# Patient Record
Sex: Female | Born: 1980 | Race: White | Hispanic: No | Marital: Married | Smoking: Never smoker
Health system: Southern US, Community
[De-identification: ages and names within clinical notes are randomized; demographics above are authoritative.]

## PROBLEM LIST (undated history)

## (undated) DIAGNOSIS — E079 Disorder of thyroid, unspecified: Secondary | ICD-10-CM

## (undated) DIAGNOSIS — E039 Hypothyroidism, unspecified: Secondary | ICD-10-CM

## (undated) DIAGNOSIS — I469 Cardiac arrest, cause unspecified: Secondary | ICD-10-CM

## (undated) HISTORY — PX: NO PAST SURGERIES: SHX2092

---

## 2017-06-10 ENCOUNTER — Emergency Department (HOSPITAL_COMMUNITY)
Admission: EM | Admit: 2017-06-10 | Discharge: 2017-06-10 | Disposition: A | Discharge status: Home / Self Care | Payer: Self-pay | Attending: Emergency Medicine | Admitting: Emergency Medicine

## 2017-06-10 ENCOUNTER — Other Ambulatory Visit: Payer: Self-pay

## 2017-06-10 ENCOUNTER — Encounter (HOSPITAL_COMMUNITY): Payer: Self-pay

## 2017-06-10 DIAGNOSIS — E039 Hypothyroidism, unspecified: Secondary | ICD-10-CM | POA: Insufficient documentation

## 2017-06-10 DIAGNOSIS — Z760 Encounter for issue of repeat prescription: Secondary | ICD-10-CM | POA: Insufficient documentation

## 2017-06-10 HISTORY — DX: Disorder of thyroid, unspecified: E07.9

## 2017-06-10 MED ORDER — LEVOTHYROXINE SODIUM 50 MCG PO TABS: ORAL_TABLET | ORAL | 0 refills | Status: DC

## 2017-06-10 NOTE — ED Provider Notes (Signed)
  [facility]-EMERGENCY DEPT Provider Note   CSN: [id number] Arrival date & time: 06/10/17  1513     History   Chief Complaint Chief Complaint  Patient presents with  . Medication Refill    HPI Jane Walton is a 36 y.o. female who presents to the ED for medication refill. [place] and does not have a PCP yet and needs her thyroid medication.   The history is provided by the patient. A language interpreter was used.  Medication Refill  Medications/supplies requested:  Levothyroxine 50 mg Reason for request:  Medications ran out Medications taken before: yes - see home medications   Patient has complete original prescription information: yes     Past Medical History:  Diagnosis Date  . Thyroid disease     There are no active problems to display for this patient.   History reviewed. No pertinent surgical history.  OB History    Gravida Para Term Preterm AB Living   1             SAB TAB Ectopic Multiple Live Births                   Home Medications    Prior to Admission medications   Medication Sig Start Date End Date Taking? Authorizing Provider  levothyroxine (SYNTHROID, LEVOTHROID) 50 MCG tablet Take one and 1/2 tablet daily. 06/10/17   Janne NapoleonNeese, Hope M, NP    Family History History reviewed. No pertinent family history.  Social History Social History   Tobacco Use  . Smoking status: Never Smoker  . Smokeless tobacco: Never Used  Substance Use Topics  . Alcohol use: Not on file  . Drug use: Not on file     Allergies   Patient has no known allergies.   Review of Systems Review of Systems Patient states no problems just needs her medication  Physical Exam Updated Vital Signs BP 112/72 (BP Location: Left Arm)   Pulse 75   Temp 98.4 F (36.9 C) (Oral)   Resp 16   LMP 05/30/2017   SpO2 100%   Physical Exam  Constitutional: She appears well-developed and well-nourished. No distress.   HENT:  Head: Normocephalic.  Eyes: EOM are normal.  Neck: Neck supple.  Cardiovascular: Normal rate.  Pulmonary/Chest: Effort normal.  Musculoskeletal: Normal range of motion.  Neurological: She is alert.  Psychiatric: She has a normal mood and affect.  Nursing note and vitals reviewed.    ED Treatments / Results  Labs (all labs ordered are listed, but only abnormal results are displayed) Labs Reviewed - No data to display  Radiology No results found.  Procedures Procedures (including critical care time)  Medications Ordered in ED Medications - No data to display   Initial Impression / Assessment and Plan / ED Course  I have reviewed the triage vital signs and the nursing notes. 36 y.o. female here for medication refill. [facility] and will refill medication for this month.   Final Clinical Impressions(s) / ED Diagnoses   Final diagnoses:  Medication refill    ED Discharge Orders        Ordered    levothyroxine (SYNTHROID, LEVOTHROID) 50 MCG tablet     06/10/17 [address]1648       Neese, [place] M, NP 06/10/17 1658    Rolland PorterJames, Mark, MD 06/17/17 1035

## 2017-06-10 NOTE — ED Triage Notes (Addendum)
Pt reports headache 3/10 since yesterday. Pt reports needing her Levothyroxine Rx refilled. [place]  Pt A+OX4, speaking in complete sentences, ambulatory w/ normal gait, pt Arabic speaking primarily.

## 2017-10-15 DIAGNOSIS — E039 Hypothyroidism, unspecified: Secondary | ICD-10-CM | POA: Insufficient documentation

## 2018-01-02 DIAGNOSIS — K5900 Constipation, unspecified: Secondary | ICD-10-CM | POA: Insufficient documentation

## 2018-01-13 ENCOUNTER — Ambulatory Visit (INDEPENDENT_AMBULATORY_CARE_PROVIDER_SITE_OTHER): Payer: Medicaid Other | Admitting: General Practice

## 2018-01-13 DIAGNOSIS — Z3201 Encounter for pregnancy test, result positive: Secondary | ICD-10-CM | POA: Diagnosis not present

## 2018-01-13 DIAGNOSIS — B360 Pityriasis versicolor: Secondary | ICD-10-CM | POA: Insufficient documentation

## 2018-01-13 NOTE — Progress Notes (Signed)
Patient presents to office today for UPT. UPT +. Patient reports first positive home test earlier this month. LMP 10/30/17 EDD 08/06/18 4850w5d. Patient [facility]. Patient reports taking phenergan, miralax, & simethicone PRN. Patient is also taking synthroid. Patient reports initially starting care at [facility] but she wasn't happy with her care there & they couldn't guarantee a female provider so she came here. Discussed with patient that this is a teaching facility and that during their pregnancy there may be female physicians and other healthcare providers involved in their care. This includes, but is not limited to, prenatal visits and ultrasound examinations, as well as, the labor and delivery process and postpartum care. Also discussed with patient that they do have the right to transfer their care to another practice in the event that they do not agree or wish to see female providers under any situation. Informed patient that we will make every attempt to have a female provider care for them, though this cannot be guaranteed, such as in an emergent situation. Also, reminded patient that when they are scheduling their appointments, to request a female provider each time and we will try to accommodate their request.  Patient verbalized understanding to this and agrees. Patient requests new OB appt here. Patient had no other questions.

## 2018-01-14 LAB — POCT PREGNANCY, URINE: Preg Test, Ur: POSITIVE — AB

## 2018-02-01 ENCOUNTER — Ambulatory Visit: Payer: Medicaid Other | Admitting: Clinical

## 2018-02-01 ENCOUNTER — Encounter: Payer: Self-pay | Admitting: Family Medicine

## 2018-02-01 ENCOUNTER — Ambulatory Visit (INDEPENDENT_AMBULATORY_CARE_PROVIDER_SITE_OTHER): Payer: Medicaid Other | Admitting: Family Medicine

## 2018-02-01 DIAGNOSIS — O09529 Supervision of elderly multigravida, unspecified trimester: Secondary | ICD-10-CM | POA: Insufficient documentation

## 2018-02-01 DIAGNOSIS — E559 Vitamin D deficiency, unspecified: Secondary | ICD-10-CM

## 2018-02-01 DIAGNOSIS — Z758 Other problems related to medical facilities and other health care: Secondary | ICD-10-CM | POA: Insufficient documentation

## 2018-02-01 DIAGNOSIS — O0990 Supervision of high risk pregnancy, unspecified, unspecified trimester: Secondary | ICD-10-CM

## 2018-02-01 DIAGNOSIS — Z789 Other specified health status: Secondary | ICD-10-CM

## 2018-02-01 DIAGNOSIS — O0991 Supervision of high risk pregnancy, unspecified, first trimester: Secondary | ICD-10-CM

## 2018-02-01 DIAGNOSIS — O09521 Supervision of elderly multigravida, first trimester: Secondary | ICD-10-CM

## 2018-02-01 DIAGNOSIS — O99281 Endocrine, nutritional and metabolic diseases complicating pregnancy, first trimester: Secondary | ICD-10-CM

## 2018-02-01 DIAGNOSIS — E039 Hypothyroidism, unspecified: Secondary | ICD-10-CM

## 2018-02-01 DIAGNOSIS — O99280 Endocrine, nutritional and metabolic diseases complicating pregnancy, unspecified trimester: Secondary | ICD-10-CM

## 2018-02-01 LAB — POCT URINALYSIS DIP (DEVICE)
BILIRUBIN URINE: NEGATIVE
Glucose, UA: NEGATIVE mg/dL
HGB URINE DIPSTICK: NEGATIVE
KETONES UR: NEGATIVE mg/dL
Leukocytes, UA: NEGATIVE
Nitrite: NEGATIVE
Protein, ur: NEGATIVE mg/dL
Specific Gravity, Urine: 1.01 (ref 1.005–1.030)
Urobilinogen, UA: 0.2 mg/dL (ref 0.0–1.0)
pH: 5.5 (ref 5.0–8.0)

## 2018-02-01 MED ORDER — CALCIUM CARBONATE-VITAMIN D 500-200 MG-UNIT PO TABS
1.0000 | ORAL_TABLET | Freq: Two times a day (BID) | ORAL | 3 refills | Status: DC
Start: 2018-02-01 — End: 2018-07-15

## 2018-02-01 NOTE — Progress Notes (Signed)
   PRENATAL VISIT NOTE  Subjective:  Jane Walton is a 37 y.o. [phone] at 3468w2d being seen today for transferring prenatal care, [facility]. Had initial labs done there  She is currently monitored for the following issues for this high-risk pregnancy and has Supervision of high risk pregnancy, antepartum; Advanced maternal age in multigravida; Hypothyroidism affecting pregnancy; Constipation; Tinea versicolor; Hypothyroidism (acquired); Language barrier; and Vitamin D deficiency on their problem list.  Patient reports no complaints.  Contractions: Not present. Vag. Bleeding: None.  Movement: Absent. Denies leaking of fluid.   The following portions of the patient's history were reviewed and updated as appropriate: allergies, current medications, past family history, past medical history, past social history, past surgical history and problem list. Problem list updated.  Objective:   Vitals:   02/01/18 1044 02/01/18 1056  BP: 113/64   Pulse: 75   Weight: 169 lb (76.7 kg)   Height:  5' 6.54" (1.69 m)    Fetal Status: Fetal Heart Rate (bpm): 156   Movement: Absent     General:  Alert, oriented and cooperative. Patient is in no acute distress.  Skin: Skin is warm and dry. No rash noted.   Cardiovascular: Normal heart rate noted  Respiratory: Normal respiratory effort, no problems with respiration noted  Abdomen: Soft, gravid, appropriate for gestational age.  Pain/Pressure: Present     Pelvic: Cervical exam deferred        Extremities: Normal range of motion.  Edema: None  Mental Status: Normal mood and affect. Normal behavior. Normal judgment and thought content.   Assessment and Plan:  Pregnancy: [id number] at 7368w2d  1. Supervision of high risk pregnancy, antepartum Anatomy us scheduled - US MFM OB DETAIL +14 WK; Future  2. Multigravida of advanced maternal age in first trimester NIPT today - US MFM OB DETAIL +14 WK; Future - Genetic Screening  3. Hypothyroidism  affecting pregnancy in first trimester Repeat labs in 2nd trimester and adjust prn  4. Language barrier Live Arabic interpreter used   5. Vitamin D deficiency Stop repletion of weekly vitamin D increased from 18-->53, change to daily supplementation. - calcium-vitamin D (OSCAL WITH D) 500-200 MG-UNIT tablet; Take 1 tablet by mouth 2 (two) times daily.  Dispense: 60 tablet; Refill: 3  General obstetric precautions including but not limited to vaginal bleeding, contractions, leaking of fluid and fetal movement were reviewed in detail with the patient. Please refer to After Visit Summary for other counseling recommendations.  Return in 1 month (on 03/01/2018).  Future Appointments  Date Time Provider Department Center  03/03/2018  9:15 AM Anyanwu, Jethro BastosUgonna A, MD [facility]  03/08/2018 11:00 AM [facility] US 3 [facility]    Reva Boresanya S Candita Borenstein, MD

## 2018-02-01 NOTE — Progress Notes (Signed)
Patient reports she has varicose veins that are causing discomfort

## 2018-02-01 NOTE — BH Specialist Note (Signed)
Integrated Behavioral Health Initial Visit  MRN: [id number] Name: Jane Walton  Number of Integrated Behavioral Health Clinician visits:: 1/6 Session Start time: 10:20  Session End time: 10:27 Total time: 7 minutes  Type of Service: Integrated Behavioral Health- Individual/Family Interpretor:Yes.   Interpretor Name and Language: Arabic   Warm Hand Off Completed.       SUBJECTIVE: Jane Haringnwar Myrie is a 37 y.o. female accompanied by Partner/Significant Other Patient was referred by Tinnie Gensanya Pratt, MD for initial OB introduction to integrated behavioral health services. Patient reports the following symptoms/concerns: Pt has no specific concerns today.  Duration of problem: n/a; Severity of problem: n/a  OBJECTIVE: Mood: Appropriate and Affect: Appropriate Risk of harm to self or others: No plan to harm self or others  LIFE CONTEXT: Family and Social: - School/Work: - Self-Care: - Life Changes: Current pregnancy  GOALS ADDRESSED: n/a INTERVENTIONS:   Standardized Assessments completed: GAD-7 and PHQ 9  ASSESSMENT: Patient currently experiencing Supervision of high risk pregnancy, antepartum   Patient may benefit from initial OB introduction to integrated behavioral health services.  PLAN: 1. Follow up with behavioral health clinician on : As needed 2. Referral(s): [facility]) 3. "From scale of 1-10, how likely are you to follow plan?": 10  Rae LipsJamie C McMannes, LCSW  [facility] screen [facility] 02/01/2018  Decreased Interest 0  Down, Depressed, Hopeless 0  PHQ - 2 Score 0  Altered sleeping 0  Tired, decreased energy 0  Change in appetite 0  Feeling bad or failure about yourself  0  Trouble concentrating 0  Moving slowly or fidgety/restless 0  Suicidal thoughts 0  PHQ-9 Score 0   GAD 7 : Generalized Anxiety Score 02/01/2018  Nervous, Anxious, on Edge 0  Control/stop worrying 0  Worry too much - different things 0  Trouble  relaxing 0  Restless 0  Easily annoyed or irritable 0  Afraid - awful might happen 0  Total GAD 7 Score 0

## 2018-02-01 NOTE — Patient Instructions (Addendum)
SELENIUM SULFIDE (se LEE nee um suhl fahyd) Shampoo               .                 . COMMON BRAND NAME (S): Anti-Dandruff Dandrex Selenos SelRx Selseb Selsun Selsun Blue                         : -        -                  .     -           .     .     .     .     .       .            .        .       .                       .         .          .      .  :                   . :     .      .            .           .      .         .                .        Marland Kitchen.                      Marland Kitchen.                     Marland Kitchen.                     .                      5       .         48         .     .      .                         : -                      (          ):           .         Marland Kitchen.      FDA  1-800-FDA-1088.           .       15  30   (59  86  ).   .           . :    .      .                 .Marland Kitchen  2018 Elsevier / Doylene Canning Standard (2008-02-16 15:28:51)

## 2018-02-17 ENCOUNTER — Encounter: Payer: Self-pay | Admitting: *Deleted

## 2018-03-02 ENCOUNTER — Encounter (HOSPITAL_COMMUNITY): Payer: Self-pay

## 2018-03-03 ENCOUNTER — Ambulatory Visit (INDEPENDENT_AMBULATORY_CARE_PROVIDER_SITE_OTHER): Payer: Medicaid Other | Admitting: Obstetrics & Gynecology

## 2018-03-03 VITALS — BP 117/62 | HR 78 | Wt 170.0 lb

## 2018-03-03 DIAGNOSIS — O99282 Endocrine, nutritional and metabolic diseases complicating pregnancy, second trimester: Secondary | ICD-10-CM

## 2018-03-03 DIAGNOSIS — O09529 Supervision of elderly multigravida, unspecified trimester: Secondary | ICD-10-CM

## 2018-03-03 DIAGNOSIS — E039 Hypothyroidism, unspecified: Secondary | ICD-10-CM

## 2018-03-03 DIAGNOSIS — O0990 Supervision of high risk pregnancy, unspecified, unspecified trimester: Secondary | ICD-10-CM

## 2018-03-03 NOTE — Progress Notes (Signed)
   PRENATAL VISIT NOTE  Subjective:  Jane Walton is a 37 y.o. [phone] at [redacted]w[redacted]d being seen today for ongoing prenatal care.  Due to language barrier, an Arabic interpreter was present during the history-taking and subsequent discussion (and for part of the physical exam) with this patient. Accompanied by her husband. She is currently monitored for the following issues for this high-risk pregnancy and has Supervision of high risk pregnancy, antepartum; Advanced maternal age in multigravida; Hypothyroidism affecting pregnancy; Constipation; Tinea versicolor; Hypothyroidism (acquired); Language barrier; and Vitamin D deficiency on their problem list.  Patient reports no complaints.  Contractions: Not present. Vag. Bleeding: None.   . Denies leaking of fluid.   The following portions of the patient's history were reviewed and updated as appropriate: allergies, current medications, past family history, past medical history, past social history, past surgical history and problem list. Problem list updated.  Objective:   Vitals:   03/03/18 0946  BP: 117/62  Pulse: 78  Weight: 170 lb (77.1 kg)    Fetal Status: Fetal Heart Rate (bpm): 152         General:  Alert, oriented and cooperative. Patient is in no acute distress.  Skin: Skin is warm and dry. No rash noted.   Cardiovascular: Normal heart rate noted  Respiratory: Normal respiratory effort, no problems with respiration noted  Abdomen: Soft, gravid, appropriate for gestational age.  Pain/Pressure: Present     Pelvic: Cervical exam deferred        Extremities: Normal range of motion.  Edema: None  Mental Status: Normal mood and affect. Normal behavior. Normal judgment and thought content.   Assessment and Plan:  Pregnancy: [id number] at [redacted]w[redacted]d  1. Antepartum multigravida of advanced maternal age Low risk female on NIPS. Anatomy scan scheduled. AFP today. - AFP, Serum, Open Spina Bifida  2. Hypothyroidism affecting pregnancy in second  trimester Will check labs today. On Synthroid 88 daily.  - TSH - T4, free - T3, free  3. Supervision of high risk pregnancy, antepartum No other complaints or concerns.  Routine obstetric precautions reviewed. Please refer to After Visit Summary for other counseling recommendations.  Return in about 4 weeks (around 03/31/2018) for OB Visit (HOB).  Future Appointments  Date Time Provider Department Center  03/08/2018 11:00 AM [facility] [place] 3 [facility]    Jaynie Collins, MD

## 2018-03-03 NOTE — Progress Notes (Signed)
Video Interperter # [id number]

## 2018-03-03 NOTE — Patient Instructions (Addendum)
Return to clinic for any scheduled appointments or obstetric concerns, or go to [facility] for evaluation  

## 2018-03-05 LAB — T4, FREE: Free T4: 1.38 ng/dL (ref 0.82–1.77)

## 2018-03-05 LAB — AFP, SERUM, OPEN SPINA BIFIDA
AFP MoM: 0.95
AFP Value: 35.1 ng/mL
GEST. AGE ON COLLECTION DATE: 17.6 wk
Maternal Age At EDD: 37 yr
OSBR Risk 1 IN: 10000
Test Results:: NEGATIVE
WEIGHT: 170 [lb_av]

## 2018-03-05 LAB — TSH: TSH: 4.11 u[IU]/mL (ref 0.450–4.500)

## 2018-03-05 LAB — T3, FREE: T3, Free: 2.9 pg/mL (ref 2.0–4.4)

## 2018-03-08 ENCOUNTER — Other Ambulatory Visit (HOSPITAL_COMMUNITY): Payer: Self-pay | Admitting: *Deleted

## 2018-03-08 ENCOUNTER — Ambulatory Visit (HOSPITAL_COMMUNITY)
Admission: RE | Admit: 2018-03-08 | Discharge: 2018-03-08 | Disposition: A | Discharge status: Home / Self Care | Payer: Medicaid Other | Source: Ambulatory Visit | Attending: Family Medicine | Admitting: Family Medicine

## 2018-03-08 ENCOUNTER — Encounter (HOSPITAL_COMMUNITY): Payer: Self-pay

## 2018-03-08 DIAGNOSIS — O0992 Supervision of high risk pregnancy, unspecified, second trimester: Secondary | ICD-10-CM | POA: Insufficient documentation

## 2018-03-08 DIAGNOSIS — Z3A18 18 weeks gestation of pregnancy: Secondary | ICD-10-CM | POA: Diagnosis not present

## 2018-03-08 DIAGNOSIS — O09522 Supervision of elderly multigravida, second trimester: Secondary | ICD-10-CM | POA: Diagnosis not present

## 2018-03-08 DIAGNOSIS — Z362 Encounter for other antenatal screening follow-up: Secondary | ICD-10-CM

## 2018-03-08 DIAGNOSIS — O09521 Supervision of elderly multigravida, first trimester: Secondary | ICD-10-CM

## 2018-03-08 DIAGNOSIS — O0990 Supervision of high risk pregnancy, unspecified, unspecified trimester: Secondary | ICD-10-CM | POA: Diagnosis present

## 2018-03-08 DIAGNOSIS — E039 Hypothyroidism, unspecified: Secondary | ICD-10-CM | POA: Insufficient documentation

## 2018-03-08 DIAGNOSIS — Z363 Encounter for antenatal screening for malformations: Secondary | ICD-10-CM | POA: Diagnosis not present

## 2018-03-08 DIAGNOSIS — O99282 Endocrine, nutritional and metabolic diseases complicating pregnancy, second trimester: Secondary | ICD-10-CM | POA: Diagnosis not present

## 2018-03-31 ENCOUNTER — Ambulatory Visit (INDEPENDENT_AMBULATORY_CARE_PROVIDER_SITE_OTHER): Payer: Medicaid Other | Admitting: Obstetrics and Gynecology

## 2018-03-31 ENCOUNTER — Encounter (HOSPITAL_COMMUNITY): Payer: Self-pay

## 2018-03-31 ENCOUNTER — Other Ambulatory Visit (HOSPITAL_COMMUNITY): Payer: Self-pay | Admitting: *Deleted

## 2018-03-31 ENCOUNTER — Ambulatory Visit (HOSPITAL_COMMUNITY)
Admission: RE | Admit: 2018-03-31 | Discharge: 2018-03-31 | Disposition: A | Discharge status: Home / Self Care | Payer: Medicaid Other | Source: Ambulatory Visit | Attending: Family Medicine | Admitting: Family Medicine

## 2018-03-31 VITALS — BP 95/72 | HR 77 | Wt 179.7 lb

## 2018-03-31 DIAGNOSIS — O0990 Supervision of high risk pregnancy, unspecified, unspecified trimester: Secondary | ICD-10-CM

## 2018-03-31 DIAGNOSIS — Z3A21 21 weeks gestation of pregnancy: Secondary | ICD-10-CM

## 2018-03-31 DIAGNOSIS — Z789 Other specified health status: Secondary | ICD-10-CM | POA: Diagnosis not present

## 2018-03-31 DIAGNOSIS — E039 Hypothyroidism, unspecified: Secondary | ICD-10-CM

## 2018-03-31 DIAGNOSIS — O99282 Endocrine, nutritional and metabolic diseases complicating pregnancy, second trimester: Secondary | ICD-10-CM

## 2018-03-31 DIAGNOSIS — Z23 Encounter for immunization: Secondary | ICD-10-CM

## 2018-03-31 DIAGNOSIS — O0992 Supervision of high risk pregnancy, unspecified, second trimester: Secondary | ICD-10-CM | POA: Diagnosis present

## 2018-03-31 DIAGNOSIS — Z362 Encounter for other antenatal screening follow-up: Secondary | ICD-10-CM | POA: Diagnosis present

## 2018-03-31 DIAGNOSIS — O09522 Supervision of elderly multigravida, second trimester: Secondary | ICD-10-CM

## 2018-03-31 NOTE — Patient Instructions (Signed)
Vitamin D Deficiency °Vitamin D deficiency is when your body does not have enough vitamin D. Vitamin D is important to your body for many reasons: °· It helps the body to absorb two important minerals, called calcium and phosphorus. °· It plays a role in bone health. °· It may help to prevent some diseases, such as diabetes and multiple sclerosis. °· It plays a role in muscle function, including heart function. ° °You can get vitamin D by: °· Eating foods that naturally contain vitamin D. °· Eating or drinking milk or other dairy products that have vitamin D added to them. °· Taking a vitamin D supplement or a multivitamin supplement that contains vitamin D. °· Being in the sun. Your body naturally makes vitamin D when your skin is exposed to sunlight. Your body changes the sunlight into a form of the vitamin that the body can use. ° °If vitamin D deficiency is severe, it can cause a condition in which your bones become soft. In adults, this condition is called osteomalacia. In children, this condition is called rickets. °What are the causes? °Vitamin D deficiency may be caused by: °· Not eating enough foods that contain vitamin D. °· Not getting enough sun exposure. °· Having certain digestive system diseases that make it difficult for your body to absorb vitamin D. These diseases include Crohn disease, chronic pancreatitis, and cystic fibrosis. °· Having a surgery in which a part of the stomach or a part of the small intestine is removed. °· Being obese. °· Having chronic kidney disease or liver disease. ° °What increases the risk? °This condition is more likely to develop in: °· Older people. °· People who do not spend much time outdoors. °· People who live in a long-term care facility. °· People who have had broken bones. °· People with weak or thin bones (osteoporosis). °· People who have a disease or condition that changes how the body absorbs vitamin D. °· People who have dark skin. °· People who take certain  medicines, such as steroid medicines or certain seizure medicines. °· People who are overweight or obese. ° °What are the signs or symptoms? °In mild cases of vitamin D deficiency, there may not be any symptoms. If the condition is severe, symptoms may include: °· Bone pain. °· Muscle pain. °· Falling often. °· Broken bones caused by a minor injury. ° °How is this diagnosed? °This condition is usually diagnosed with a blood test. °How is this treated? °Treatment for this condition may depend on what caused the condition. Treatment options include: °· Taking vitamin D supplements. °· Taking a calcium supplement. Your health care provider will suggest what dose is best for you. ° °Follow these instructions at home: °· Take medicines and supplements only as told by your health care provider. °· Eat foods that contain vitamin D. Choices include: °? Fortified dairy products, cereals, or juices. Fortified means that vitamin D has been added to the food. Check the label on the package to be sure. °? Fatty fish, such as salmon or trout. °? Eggs. °? Oysters. °· Do not use a tanning bed. °· Maintain a healthy weight. Lose weight, if needed. °· Keep all follow-up visits as told by your health care provider. This is important. °Contact a health care provider if: °· Your symptoms do not go away. °· You feel like throwing up (nausea) or you throw up (vomit). °· You have fewer bowel movements than usual or it is difficult for you to have a   bowel movement (constipation). °This information is not intended to replace advice given to you by your health care provider. Make sure you discuss any questions you have with your health care provider. °Document Released: 09/08/2011 Document Revised: 11/28/2015 Document Reviewed: 11/01/2014 °Elsevier Interactive Patient Education © 2018 Elsevier Inc. ° °

## 2018-03-31 NOTE — Progress Notes (Signed)
   PRENATAL VISIT NOTE  Subjective:  Jane Walton is a 37 y.o. [phone] at [redacted]w[redacted]d being seen today for ongoing prenatal care.  She is currently monitored for the following issues for this high-risk pregnancy and has Supervision of high risk pregnancy, antepartum; Advanced maternal age in multigravida; Hypothyroidism affecting pregnancy; Constipation; Tinea versicolor; Hypothyroidism (acquired); Language barrier; and Vitamin D deficiency on their problem list.  Patient reports occasional mild cramping.  Contractions: Not present. Vag. Bleeding: None.  Movement: Present. Denies leaking of fluid.   The following portions of the patient's history were reviewed and updated as appropriate: allergies, current medications, past family history, past medical history, past social history, past surgical history and problem list. Problem list updated.  Objective:   Vitals:   03/31/18 0946  BP: 95/72  Pulse: 77  Weight: 179 lb 11.2 oz (81.5 kg)    Fetal Status: Fetal Heart Rate (bpm): 146   Movement: Present     General:  Alert, oriented and cooperative. Patient is in no acute distress.  Skin: Skin is warm and dry. No rash noted.   Cardiovascular: Normal heart rate noted  Respiratory: Normal respiratory effort, no problems with respiration noted  Abdomen: Soft, gravid, appropriate for gestational age.  Pain/Pressure: Present     Pelvic: Cervical exam deferred        Extremities: Normal range of motion.  Edema: None  Mental Status: Normal mood and affect. Normal behavior. Normal judgment and thought content.   Assessment and Plan:  Pregnancy: [id number] at [redacted]w[redacted]d  1. Supervision of high risk pregnancy, antepartum Anatomy normal, f/u for back views today Needs rubella screen next visit ([facility]B labs done at [facility])  2. Multigravida of advanced maternal age in second trimester  3. Language barrier Print production planner used  4. Hypothyroidism affecting pregnancy in second trimester Last labwork wnl Cont  synthroid 88 mcg Recheck with 3rd trim labwork   Preterm labor symptoms and general obstetric precautions including but not limited to vaginal bleeding, contractions, leaking of fluid and fetal movement were reviewed in detail with the patient. Please refer to After Visit Summary for other counseling recommendations.  Return in about 4 weeks (around 04/28/2018) for OB visit.  No future appointments.  Conan Bowens, MD

## 2018-04-28 ENCOUNTER — Encounter: Payer: Self-pay | Admitting: Obstetrics and Gynecology

## 2018-04-28 ENCOUNTER — Encounter (HOSPITAL_COMMUNITY): Payer: Self-pay

## 2018-04-28 ENCOUNTER — Ambulatory Visit (INDEPENDENT_AMBULATORY_CARE_PROVIDER_SITE_OTHER): Payer: Medicaid Other | Admitting: Obstetrics and Gynecology

## 2018-04-28 ENCOUNTER — Ambulatory Visit (HOSPITAL_COMMUNITY)
Admission: RE | Admit: 2018-04-28 | Discharge: 2018-04-28 | Disposition: A | Discharge status: Home / Self Care | Payer: Medicaid Other | Source: Ambulatory Visit | Attending: Obstetrics and Gynecology | Admitting: Obstetrics and Gynecology

## 2018-04-28 VITALS — BP 112/65 | HR 85 | Wt 186.7 lb

## 2018-04-28 DIAGNOSIS — Z3A25 25 weeks gestation of pregnancy: Secondary | ICD-10-CM | POA: Diagnosis not present

## 2018-04-28 DIAGNOSIS — O99282 Endocrine, nutritional and metabolic diseases complicating pregnancy, second trimester: Secondary | ICD-10-CM | POA: Insufficient documentation

## 2018-04-28 DIAGNOSIS — E039 Hypothyroidism, unspecified: Secondary | ICD-10-CM

## 2018-04-28 DIAGNOSIS — O09522 Supervision of elderly multigravida, second trimester: Secondary | ICD-10-CM | POA: Insufficient documentation

## 2018-04-28 DIAGNOSIS — Z362 Encounter for other antenatal screening follow-up: Secondary | ICD-10-CM | POA: Insufficient documentation

## 2018-04-28 DIAGNOSIS — O99283 Endocrine, nutritional and metabolic diseases complicating pregnancy, third trimester: Secondary | ICD-10-CM

## 2018-04-28 DIAGNOSIS — O99280 Endocrine, nutritional and metabolic diseases complicating pregnancy, unspecified trimester: Secondary | ICD-10-CM

## 2018-04-28 DIAGNOSIS — Z789 Other specified health status: Secondary | ICD-10-CM

## 2018-04-28 DIAGNOSIS — O09529 Supervision of elderly multigravida, unspecified trimester: Secondary | ICD-10-CM

## 2018-04-28 DIAGNOSIS — O0990 Supervision of high risk pregnancy, unspecified, unspecified trimester: Secondary | ICD-10-CM

## 2018-04-28 NOTE — Progress Notes (Signed)
Concerned about weight gain- we discussed is about desired weight gain. Discussed eating small amounts .She states she eats small amounts every 2-3 hours and no sweets. Explained will do fasting 2 hour next visit. Discussed types of food choices.

## 2018-04-28 NOTE — Patient Instructions (Signed)

## 2018-04-28 NOTE — Progress Notes (Signed)
   PRENATAL VISIT NOTE  Subjective:  Jane Walton is a 37 y.o. [phone] at [redacted]w[redacted]d being seen today for ongoing prenatal care.  She is currently monitored for the following issues for this high-risk pregnancy and has Supervision of high risk pregnancy, antepartum; Advanced maternal age in multigravida; Hypothyroidism affecting pregnancy; Constipation; Tinea versicolor; Hypothyroidism (acquired); Language barrier; and Vitamin D deficiency on their problem list.  Patient reports no complaints. Some pain when she walks for prolonged periods of time.  Contractions: Not present. Vag. Bleeding: None.  Movement: Present. Denies leaking of fluid.   The following portions of the patient's history were reviewed and updated as appropriate: allergies, current medications, past family history, past medical history, past social history, past surgical history and problem list. Problem list updated.  Objective:   Vitals:   04/28/18 1103  BP: 112/65  Pulse: 85  Weight: 186 lb 11.2 oz (84.7 kg)    Fetal Status: Fetal Heart Rate (bpm): 146   Movement: Present     General:  Alert, oriented and cooperative. Patient is in no acute distress.  Skin: Skin is warm and dry. No rash noted.   Cardiovascular: Normal heart rate noted  Respiratory: Normal respiratory effort, no problems with respiration noted  Abdomen: Soft, gravid, appropriate for gestational age.  Pain/Pressure: Present     Pelvic: Cervical exam deferred        Extremities: Normal range of motion.  Edema: None  Mental Status: Normal mood and affect. Normal behavior. Normal judgment and thought content.   Assessment and Plan:  Pregnancy: [id number] at [redacted]w[redacted]d  1. Supervision of high risk pregnancy, antepartum Concerned about weight gain, reviewed diet, portion control, drinking less juice, encouraged walking as long as she doesn't have much pain  2. Antepartum multigravida of advanced maternal age  64. Hypothyroidism affecting pregnancy,  antepartum Repeat labs next visit  4. Language barrier Print production planner used  Preterm labor symptoms and general obstetric precautions including but not limited to vaginal bleeding, contractions, leaking of fluid and fetal movement were reviewed in detail with the patient. Please refer to After Visit Summary for other counseling recommendations.  Return in about 2 weeks (around 05/12/2018) for needs fasting 28 wk labs/ obfu, OB visit (MD), 2 hr GTT, 3rd trim labs.  No future appointments.  Conan Bowens, MD

## 2018-05-13 ENCOUNTER — Other Ambulatory Visit: Payer: Medicaid Other

## 2018-05-13 ENCOUNTER — Encounter: Payer: Self-pay | Admitting: Obstetrics and Gynecology

## 2018-05-13 ENCOUNTER — Other Ambulatory Visit: Payer: Self-pay | Admitting: *Deleted

## 2018-05-13 ENCOUNTER — Ambulatory Visit (INDEPENDENT_AMBULATORY_CARE_PROVIDER_SITE_OTHER): Payer: Medicaid Other | Admitting: Obstetrics and Gynecology

## 2018-05-13 VITALS — BP 107/64 | HR 86 | Wt 190.0 lb

## 2018-05-13 DIAGNOSIS — O0990 Supervision of high risk pregnancy, unspecified, unspecified trimester: Secondary | ICD-10-CM

## 2018-05-13 DIAGNOSIS — Z789 Other specified health status: Secondary | ICD-10-CM | POA: Diagnosis not present

## 2018-05-13 DIAGNOSIS — O99282 Endocrine, nutritional and metabolic diseases complicating pregnancy, second trimester: Secondary | ICD-10-CM

## 2018-05-13 DIAGNOSIS — E039 Hypothyroidism, unspecified: Secondary | ICD-10-CM | POA: Diagnosis not present

## 2018-05-13 DIAGNOSIS — O0992 Supervision of high risk pregnancy, unspecified, second trimester: Secondary | ICD-10-CM | POA: Diagnosis not present

## 2018-05-13 DIAGNOSIS — O99283 Endocrine, nutritional and metabolic diseases complicating pregnancy, third trimester: Secondary | ICD-10-CM

## 2018-05-13 DIAGNOSIS — O09529 Supervision of elderly multigravida, unspecified trimester: Secondary | ICD-10-CM

## 2018-05-13 DIAGNOSIS — Z23 Encounter for immunization: Secondary | ICD-10-CM | POA: Diagnosis not present

## 2018-05-13 DIAGNOSIS — O09522 Supervision of elderly multigravida, second trimester: Secondary | ICD-10-CM

## 2018-05-13 DIAGNOSIS — O09523 Supervision of elderly multigravida, third trimester: Secondary | ICD-10-CM

## 2018-05-13 NOTE — Progress Notes (Signed)
   PRENATAL VISIT NOTE  Subjective:  Jane Walton is a 37 y.o. [phone] at 2073w5d being seen today for ongoing prenatal care.  She is currently monitored for the following issues for this high-risk pregnancy and has Supervision of high risk pregnancy, antepartum; Advanced maternal age in multigravida; Hypothyroidism affecting pregnancy; Constipation; Tinea versicolor; Hypothyroidism (acquired); Language barrier; and Vitamin D deficiency on their problem list.  Patient reports no complaints.  Contractions: Not present. Vag. Bleeding: None.  Movement: Present. Denies leaking of fluid.   The following portions of the patient's history were reviewed and updated as appropriate: allergies, current medications, past family history, past medical history, past social history, past surgical history and problem list. Problem list updated.  Objective:   Vitals:   05/13/18 0833  BP: 107/64  Pulse: 86  Weight: 190 lb (86.2 kg)    Fetal Status: Fetal Heart Rate (bpm): 144 Fundal Height: 28 cm Movement: Present     General:  Alert, oriented and cooperative. Patient is in no acute distress.  Skin: Skin is warm and dry. No rash noted.   Cardiovascular: Normal heart rate noted  Respiratory: Normal respiratory effort, no problems with respiration noted  Abdomen: Soft, gravid, appropriate for gestational age.  Pain/Pressure: Absent     Pelvic: Cervical exam deferred        Extremities: Normal range of motion.  Edema: None  Mental Status: Normal mood and affect. Normal behavior. Normal judgment and thought content.   Assessment and Plan:  Pregnancy: [id number] at 2973w5d  1. Supervision of high risk pregnancy, antepartum Patient is doing well without complaints Third trimester labs today Patient plans IUD   2. Hypothyroidism affecting pregnancy in third trimester TSH today  3. Multigravida of advanced maternal age in third trimester Low risk NIPS   4. Language barrier Arabic interpreter  present  Preterm labor symptoms and general obstetric precautions including but not limited to vaginal bleeding, contractions, leaking of fluid and fetal movement were reviewed in detail with the patient. Please refer to After Visit Summary for other counseling recommendations.  Return in about 2 weeks (around 05/27/2018) for ROB.  Future Appointments  Date Time Provider Department Center  05/13/2018  9:30 AM [facility]    Catalina AntiguaPeggy Jahfari Ambers, MD

## 2018-05-14 LAB — CBC
HEMATOCRIT: 34.4 % (ref 34.0–46.6)
HEMOGLOBIN: 11.8 g/dL (ref 11.1–15.9)
MCH: 29.6 pg (ref 26.6–33.0)
MCHC: 34.3 g/dL (ref 31.5–35.7)
MCV: 86 fL (ref 79–97)
Platelets: 190 10*3/uL (ref 150–450)
RBC: 3.98 x10E6/uL (ref 3.77–5.28)
RDW: 12.1 % — ABNORMAL LOW (ref 12.3–15.4)
WBC: 7.1 10*3/uL (ref 3.4–10.8)

## 2018-05-14 LAB — GLUCOSE TOLERANCE, 2 HOURS W/ 1HR
GLUCOSE, 1 HOUR: 133 mg/dL (ref 65–179)
Glucose, 2 hour: 113 mg/dL (ref 65–152)
Glucose, Fasting: 75 mg/dL (ref 65–91)

## 2018-05-14 LAB — RPR: RPR Ser Ql: NONREACTIVE

## 2018-05-14 LAB — RUBELLA SCREEN: Rubella Antibodies, IGG: 9.06 index (ref >0.99)

## 2018-05-14 LAB — HIV ANTIBODY (ROUTINE TESTING W REFLEX): HIV Screen 4th Generation wRfx: NONREACTIVE

## 2018-05-14 LAB — TSH: TSH: 3.22 u[IU]/mL (ref 0.450–4.500)

## 2018-06-01 ENCOUNTER — Encounter: Payer: Medicaid Other | Admitting: Family Medicine

## 2018-06-02 ENCOUNTER — Ambulatory Visit (INDEPENDENT_AMBULATORY_CARE_PROVIDER_SITE_OTHER): Payer: Medicaid Other | Admitting: Obstetrics & Gynecology

## 2018-06-02 VITALS — BP 107/64 | HR 85 | Wt 191.4 lb

## 2018-06-02 DIAGNOSIS — Z789 Other specified health status: Secondary | ICD-10-CM

## 2018-06-02 DIAGNOSIS — O09523 Supervision of elderly multigravida, third trimester: Secondary | ICD-10-CM

## 2018-06-02 DIAGNOSIS — Z3A30 30 weeks gestation of pregnancy: Secondary | ICD-10-CM

## 2018-06-02 DIAGNOSIS — O0990 Supervision of high risk pregnancy, unspecified, unspecified trimester: Secondary | ICD-10-CM

## 2018-06-02 DIAGNOSIS — Z603 Acculturation difficulty: Secondary | ICD-10-CM

## 2018-06-02 DIAGNOSIS — O0993 Supervision of high risk pregnancy, unspecified, third trimester: Secondary | ICD-10-CM

## 2018-06-02 DIAGNOSIS — O99283 Endocrine, nutritional and metabolic diseases complicating pregnancy, third trimester: Secondary | ICD-10-CM

## 2018-06-02 DIAGNOSIS — E039 Hypothyroidism, unspecified: Secondary | ICD-10-CM

## 2018-06-02 NOTE — Progress Notes (Signed)
   PRENATAL VISIT NOTE  Subjective:  Jane Walton is a 37 y.o. [phone] at 4962w4d being seen today for ongoing prenatal care.  She is currently monitored for the following issues for this high-risk pregnancy and has Supervision of high risk pregnancy, antepartum; Advanced maternal age in multigravida; Hypothyroidism affecting pregnancy; Constipation; Tinea versicolor; Hypothyroidism (acquired); Language barrier; and Vitamin D deficiency on their problem list.  Patient reports no complaints.  Contractions: Not present. Vag. Bleeding: None.  Movement: Present. Denies leaking of fluid.   The following portions of the patient's history were reviewed and updated as appropriate: allergies, current medications, past family history, past medical history, past social history, past surgical history and problem list. Problem list updated.  Objective:   Vitals:   06/02/18 1030  BP: 107/64  Pulse: 85  Weight: 191 lb 5.8 oz (86.8 kg)    Fetal Status: Fetal Heart Rate (bpm): 150   Movement: Present     General:  Alert, oriented and cooperative. Patient is in no acute distress.  Skin: Skin is warm and dry. No rash noted.   Cardiovascular: Normal heart rate noted  Respiratory: Normal respiratory effort, no problems with respiration noted  Abdomen: Soft, gravid, appropriate for gestational age.  Pain/Pressure: Absent     Pelvic: Cervical exam deferred        Extremities: Normal range of motion.  Edema: None  Mental Status: Normal mood and affect. Normal behavior. Normal judgment and thought content.   Assessment and Plan:  Pregnancy: [id number] at 5362w4d  1. Multigravida of advanced maternal age in third trimester   2. Supervision of high risk pregnancy, antepartum   3. Language barrier - Live interpretor present  4. Hypothyroidism affecting pregnancy in third trimester - normal TSH 11/19  Preterm labor symptoms and general obstetric precautions including but not limited to vaginal bleeding,  contractions, leaking of fluid and fetal movement were reviewed in detail with the patient. Please refer to After Visit Summary for other counseling recommendations.  No follow-ups on file.  No future appointments.  Allie BossierMyra C Kaylor Maiers, MD

## 2018-06-02 NOTE — Progress Notes (Signed)
C/O headaches x1 month and last nose bleed was a week ago.

## 2018-06-17 ENCOUNTER — Ambulatory Visit (INDEPENDENT_AMBULATORY_CARE_PROVIDER_SITE_OTHER): Payer: Medicaid Other | Admitting: Obstetrics & Gynecology

## 2018-06-17 VITALS — BP 124/85 | HR 89 | Wt 192.9 lb

## 2018-06-17 DIAGNOSIS — O0990 Supervision of high risk pregnancy, unspecified, unspecified trimester: Secondary | ICD-10-CM

## 2018-06-17 DIAGNOSIS — E039 Hypothyroidism, unspecified: Secondary | ICD-10-CM

## 2018-06-17 DIAGNOSIS — O09523 Supervision of elderly multigravida, third trimester: Secondary | ICD-10-CM

## 2018-06-17 DIAGNOSIS — O0993 Supervision of high risk pregnancy, unspecified, third trimester: Secondary | ICD-10-CM

## 2018-06-17 DIAGNOSIS — Z789 Other specified health status: Secondary | ICD-10-CM

## 2018-06-17 DIAGNOSIS — O99283 Endocrine, nutritional and metabolic diseases complicating pregnancy, third trimester: Secondary | ICD-10-CM

## 2018-06-17 NOTE — Progress Notes (Signed)
   PRENATAL VISIT NOTE  Subjective:  Jane Walton is a 37 y.o. [phone] at 8239w5d being seen today for ongoing prenatal care.  She is currently monitored for the following issues for this high-risk pregnancy and has Supervision of high risk pregnancy, antepartum; Advanced maternal age in multigravida; Hypothyroidism affecting pregnancy; Constipation; Tinea versicolor; Hypothyroidism (acquired); Language barrier; and Vitamin D deficiency on their problem list.  Patient reports she feels "shocks" in the bottom of both feet when she stands for about a week. .  Contractions: Not present. Vag. Bleeding: None.  Movement: Present. Denies leaking of fluid.   The following portions of the patient's history were reviewed and updated as appropriate: allergies, current medications, past family history, past medical history, past social history, past surgical history and problem list. Problem list updated.  Objective:   Vitals:   06/17/18 0907  BP: 124/85  Pulse: 89  Weight: 192 lb 14.4 oz (87.5 kg)    Fetal Status: Fetal Heart Rate (bpm): 147   Movement: Present     General:  Alert, oriented and cooperative. Patient is in no acute distress.  Skin: Skin is warm and dry. No rash noted.   Cardiovascular: Normal heart rate noted  Respiratory: Normal respiratory effort, no problems with respiration noted  Abdomen: Soft, gravid, appropriate for gestational age.  Pain/Pressure: Present     Pelvic: Cervical exam deferred        Extremities: Normal range of motion.  Edema: None  Mental Status: Normal mood and affect. Normal behavior. Normal judgment and thought content.   Assessment and Plan:  Pregnancy: [id number] at 5039w5d  1. Language barrier - live interpretor present  2. Supervision of high risk pregnancy, antepartum   3. Multigravida of advanced maternal age in third trimester   4. Hypothyroidism affecting pregnancy in third trimester - TSH normal 11/19 5. Pain in feet- rec'd a  chiropractor  Preterm labor symptoms and general obstetric precautions including but not limited to vaginal bleeding, contractions, leaking of fluid and fetal movement were reviewed in detail with the patient. Please refer to After Visit Summary for other counseling recommendations.  No follow-ups on file.  Future Appointments  Date Time Provider Department Center  07/01/2018 10:35 AM Reva BoresPratt, Tanya S, [facility]  07/15/2018 10:55 AM Allie Bossierove, Woodie Trusty C, [facility]  07/22/2018  9:35 AM Allie Bossierove, Paislee Szatkowski C, MD [facility]  07/29/2018  9:35 AM Allie Bossierove, An Lannan C, MD [facility]    Allie BossierMyra C Raphael Fitzpatrick, MD

## 2018-06-17 NOTE — Patient Instructions (Signed)
Pain Relief During Labor and Delivery  Many things can cause pain during labor and delivery, including:  · Pressure on bones and ligaments due to the baby moving through the pelvis.  · Stretching of tissues due to the baby moving through the birth canal.  · Muscle tension due to anxiety or nervousness.  · The uterus tightening (contracting) and relaxing to help move the baby.  There are many ways to deal with the pain of labor and delivery. They include:  · Taking prenatal classes. Taking these classes helps you know what to expect during your baby’s birth. What you learn will increase your confidence and decrease your anxiety.  · Practicing relaxation techniques or doing relaxing activities, such as:  ? Focused breathing.  ? Meditation.  ? Visualization.  ? Aroma therapy.  ? Listening to your favorite music.  ? Hypnosis.  · Taking a warm shower or bath (hydrotherapy). This may:  ? Provide comfort and relaxation.  ? Lessen your perception of pain.  ? Decrease the amount of pain medicine needed.  ? Decrease the length of labor.  · Getting a massage or counterpressure on your back.  · Applying warm packs or ice packs.  · Changing positions often, moving around, or using a birthing ball.  · Getting:  ? Pain medicine through an IV or injection into a muscle.  ? Pain medicine inserted into your spinal column.  ? Injections of sterile water just under the skin on your lower back (intradermal injections).  ? Laughing gas (nitrous oxide).  Discuss your pain control options with your health care provider during your prenatal visits. Explore the options offered by your hospital or birth center.  What kinds of medicine are available?  There are two kinds of medicines that can be used to relieve pain during labor and delivery:  · Analgesics. These medicines decrease pain without causing you to lose feeling or the ability to move your muscles.  · Anesthetics. These medicines block feeling in the body and can decrease your  ability to move freely.  Both of these kinds of medicine can cause minor side effects, such as nausea, trouble concentrating, and sleepiness. They can also decrease the baby's heart rate before birth and affect the baby’s breathing rate after birth. For this reason, health care providers are careful about when and how much medicine is given.  What are specific medicines and procedures that provide pain relief?  Local Anesthetics  Local anesthetics are used to numb a small area of the body. They may be used along with another kind of anesthetic or used to numb the nerves of the vagina, cervix, and perineum during the second stage of labor.  General Anesthetics  General anesthetics cause you to lose consciousness so you do not feel pain. They are usually only used for an emergency cesarean delivery. General anesthetics are given through an IV tube and a mask.  Pudendal Block  A pudendal block is a form of local anesthetic. It may be used to relieve the pain associated with pushing or stretching of the perineum at the time of delivery or to further numb the perineum. A pudendal block is done by injecting numbing medicine through the vaginal wall into a nerve in the pelvis.  Epidural Analgesia  Epidural analgesia is given through a flexible IV catheter that is inserted into the lower back. Numbing medicine is delivered continuously to the area near your spinal column nerves (epidural space). After having this type of analgesia, you   may be able to move your legs but you most likely will not be able to walk. Depending on the amount of medicine given, you may lose all feeling in the lower half of your body, or you may retain some level of sensation, including the urge to push. Epidural analgesia can be used to provide pain relief for a vaginal birth.  Spinal Block  A spinal block is similar to epidural analgesia, but the medicine is injected into the spinal fluid instead of the epidural space. A spinal block is only given  once. It starts to relieve pain quickly, but the pain relief lasts only 1-6 hours. Spinal blocks can be used for cesarean deliveries.  Combined Spinal-Epidural (CSE) Block  A CSE block combines the effects of a spinal block and epidural analgesia. The spinal block works quickly to block all pain. The epidural analgesia provides continuous pain relief, even after the effects of the spinal block have worn off.  This information is not intended to replace advice given to you by your health care provider. Make sure you discuss any questions you have with your health care provider.  Document Released: 10/02/2008 Document Revised: 11/23/2015 Document Reviewed: 11/07/2015  Elsevier Interactive Patient Education © 2019 [employer] Inc[employer]

## 2018-06-30 NOTE — L&D Delivery Note (Addendum)
Delivery Note At 11:12 PM a viable female was delivered via Vaginal, Spontaneous (Presentation: vertex; LOA).  APGAR: 8, 9; weight pending.   Placenta status: intact, spontaneously delivered.  Cord: 3 vessels with no complications.  Cord pH: n/a  Anesthesia:  epidural Episiotomy: None Lacerations: 1st degree Suture Repair: 3.0 vicryl Est. Blood Loss (mL): 124  Mom to postpartum.  Baby to Couplet care / Skin to Skin.  Standley Brooking 08/11/2018, 11:41 PM  OB FELLOW DELIVERY ATTESTATION  I was gloved and present for the delivery in its entirety, and I agree with the above resident's note.    Marcy Siren, D.O. OB Fellow  08/14/2018, 11:34 AM

## 2018-07-01 ENCOUNTER — Ambulatory Visit (INDEPENDENT_AMBULATORY_CARE_PROVIDER_SITE_OTHER): Payer: Self-pay | Admitting: Family Medicine

## 2018-07-01 VITALS — BP 95/75 | HR 86 | Wt 194.1 lb

## 2018-07-01 DIAGNOSIS — E039 Hypothyroidism, unspecified: Secondary | ICD-10-CM

## 2018-07-01 DIAGNOSIS — O09523 Supervision of elderly multigravida, third trimester: Secondary | ICD-10-CM

## 2018-07-01 DIAGNOSIS — O0990 Supervision of high risk pregnancy, unspecified, unspecified trimester: Secondary | ICD-10-CM

## 2018-07-01 DIAGNOSIS — O0993 Supervision of high risk pregnancy, unspecified, third trimester: Secondary | ICD-10-CM

## 2018-07-01 DIAGNOSIS — O99283 Endocrine, nutritional and metabolic diseases complicating pregnancy, third trimester: Secondary | ICD-10-CM

## 2018-07-01 NOTE — Patient Instructions (Signed)

## 2018-07-01 NOTE — Progress Notes (Signed)
   PRENATAL VISIT NOTE  Subjective:  Jane Walton is a 38 y.o. [phone] at [redacted]w[redacted]d being seen today for ongoing prenatal care.  She is currently monitored for the following issues for this high-risk pregnancy and has Supervision of high risk pregnancy, antepartum; Advanced maternal age in multigravida; Hypothyroidism affecting pregnancy; Constipation; Tinea versicolor; Hypothyroidism (acquired); Language barrier; and Vitamin D deficiency on their problem list.  Patient reports no complaints.  Contractions: Irregular. Vag. Bleeding: None.  Movement: Present. Denies leaking of fluid.   The following portions of the patient's history were reviewed and updated as appropriate: allergies, current medications, past family history, past medical history, past social history, past surgical history and problem list. Problem list updated.  Objective:   Vitals:   07/01/18 1101  BP: 95/75  Pulse: 86  Weight: 194 lb 1.6 oz (88 kg)    Fetal Status: Fetal Heart Rate (bpm): 141 Fundal Height: 32 cm Movement: Present  Presentation: Vertex  General:  Alert, oriented and cooperative. Patient is in no acute distress.  Skin: Skin is warm and dry. No rash noted.   Cardiovascular: Normal heart rate noted  Respiratory: Normal respiratory effort, no problems with respiration noted  Abdomen: Soft, gravid, appropriate for gestational age.  Pain/Pressure: Present     Pelvic: Cervical exam deferred        Extremities: Normal range of motion.  Edema: None  Mental Status: Normal mood and affect. Normal behavior. Normal judgment and thought content.   Assessment and Plan:  Pregnancy: [id number] at [redacted]w[redacted]d  1. Supervision of high risk pregnancy, antepartum Continue prenatal care.   2. Hypothyroidism affecting pregnancy in third trimester Nml TSH in 3rd trimester  3. Multigravida of advanced maternal age in third trimester F/u u/s for growth ok at last check 04/28/18  Preterm labor symptoms and general obstetric  precautions including but not limited to vaginal bleeding, contractions, leaking of fluid and fetal movement were reviewed in detail with the patient. Please refer to After Visit Summary for other counseling recommendations.  Return in 2 weeks (on 07/15/2018).  Future Appointments  Date Time Provider Department Center  07/15/2018 10:55 AM Allie Bossier, MD [facility]  07/22/2018  9:35 AM Allie Bossier, MD [facility]  07/29/2018  9:35 AM Allie Bossier, MD [facility]    Reva Bores, MD

## 2018-07-15 ENCOUNTER — Other Ambulatory Visit (HOSPITAL_COMMUNITY)
Admission: RE | Admit: 2018-07-15 | Discharge: 2018-07-15 | Disposition: A | Discharge status: Home / Self Care | Payer: Medicaid Other | Source: Ambulatory Visit | Attending: Obstetrics & Gynecology | Admitting: Obstetrics & Gynecology

## 2018-07-15 ENCOUNTER — Ambulatory Visit (INDEPENDENT_AMBULATORY_CARE_PROVIDER_SITE_OTHER): Payer: Medicaid Other | Admitting: Obstetrics & Gynecology

## 2018-07-15 VITALS — BP 122/72 | HR 88 | Wt 197.1 lb

## 2018-07-15 DIAGNOSIS — O0990 Supervision of high risk pregnancy, unspecified, unspecified trimester: Secondary | ICD-10-CM | POA: Insufficient documentation

## 2018-07-15 DIAGNOSIS — Z789 Other specified health status: Secondary | ICD-10-CM

## 2018-07-15 DIAGNOSIS — E559 Vitamin D deficiency, unspecified: Secondary | ICD-10-CM

## 2018-07-15 DIAGNOSIS — O99283 Endocrine, nutritional and metabolic diseases complicating pregnancy, third trimester: Secondary | ICD-10-CM

## 2018-07-15 DIAGNOSIS — E039 Hypothyroidism, unspecified: Secondary | ICD-10-CM

## 2018-07-15 DIAGNOSIS — Z3A36 36 weeks gestation of pregnancy: Secondary | ICD-10-CM

## 2018-07-15 DIAGNOSIS — O0993 Supervision of high risk pregnancy, unspecified, third trimester: Secondary | ICD-10-CM

## 2018-07-15 DIAGNOSIS — O09523 Supervision of elderly multigravida, third trimester: Secondary | ICD-10-CM

## 2018-07-15 LAB — OB RESULTS CONSOLE GC/CHLAMYDIA: Gonorrhea: NEGATIVE

## 2018-07-15 LAB — OB RESULTS CONSOLE GBS: GBS: NEGATIVE

## 2018-07-15 MED ORDER — CALCIUM CARBONATE-VITAMIN D 500-200 MG-UNIT PO TABS: 1.0000 | ORAL_TABLET | Freq: Two times a day (BID) | ORAL | 3 refills | Status: AC

## 2018-07-15 MED ORDER — LEVOTHYROXINE SODIUM 88 MCG PO TABS: ORAL_TABLET | ORAL | 4 refills | Status: DC

## 2018-07-15 NOTE — Progress Notes (Signed)
   PRENATAL VISIT NOTE  Subjective:  Jane Walton is a 38 y.o. [phone] at [redacted]w[redacted]d being seen today for ongoing prenatal care.  She is currently monitored for the following issues for this high-risk pregnancy and has Supervision of high risk pregnancy, antepartum; Advanced maternal age in multigravida; Hypothyroidism affecting pregnancy; Constipation; Tinea versicolor; Hypothyroidism (acquired); Language barrier; and Vitamin D deficiency on their problem list.  Patient reports no complaints.  Contractions: Irregular. Vag. Bleeding: None.  Movement: Present. Denies leaking of fluid.   The following portions of the patient's history were reviewed and updated as appropriate: allergies, current medications, past family history, past medical history, past social history, past surgical history and problem list. Problem list updated.  Objective:   Vitals:   07/15/18 1047  BP: 122/72  Pulse: 88  Weight: 197 lb 1.6 oz (89.4 kg)    Fetal Status: Fetal Heart Rate (bpm): 156   Movement: Present     General:  Alert, oriented and cooperative. Patient is in no acute distress.  Skin: Skin is warm and dry. No rash noted.   Cardiovascular: Normal heart rate noted  Respiratory: Normal respiratory effort, no problems with respiration noted  Abdomen: Soft, gravid, appropriate for gestational age.  Pain/Pressure: Present     Pelvic: Cervical exam performed        Extremities: Normal range of motion.  Edema: Trace  Mental Status: Normal mood and affect. Normal behavior. Normal judgment and thought content.   Assessment and Plan:  Pregnancy: [id number] at [redacted]w[redacted]d  1. Language barrier - Interpretor present for exam  2. Hypothyroidism affecting pregnancy in third trimester   3. Multigravida of advanced maternal age in third trimester  4. Supervision of high risk pregnancy, antepartum - cervical cultures  Preterm labor symptoms and general obstetric precautions including but not limited to vaginal  bleeding, contractions, leaking of fluid and fetal movement were reviewed in detail with the patient. Please refer to After Visit Summary for other counseling recommendations.  No follow-ups on file.  Future Appointments  Date Time Provider Department Center  07/22/2018  9:35 AM Allie Bossier, MD [facility]  07/29/2018  9:35 AM Allie Bossier, MD [facility]    Allie Bossier, MD

## 2018-07-16 LAB — CERVICOVAGINAL ANCILLARY ONLY
CHLAMYDIA, DNA PROBE: NEGATIVE
NEISSERIA GONORRHEA: NEGATIVE

## 2018-07-19 LAB — CULTURE, BETA STREP (GROUP B ONLY): Strep Gp B Culture: NEGATIVE

## 2018-07-22 ENCOUNTER — Ambulatory Visit (INDEPENDENT_AMBULATORY_CARE_PROVIDER_SITE_OTHER): Payer: Medicaid Other | Admitting: Obstetrics and Gynecology

## 2018-07-22 ENCOUNTER — Encounter: Payer: Self-pay | Admitting: Obstetrics and Gynecology

## 2018-07-22 VITALS — BP 119/73 | HR 87 | Wt 196.0 lb

## 2018-07-22 DIAGNOSIS — O99283 Endocrine, nutritional and metabolic diseases complicating pregnancy, third trimester: Secondary | ICD-10-CM

## 2018-07-22 DIAGNOSIS — E039 Hypothyroidism, unspecified: Secondary | ICD-10-CM

## 2018-07-22 DIAGNOSIS — Z789 Other specified health status: Secondary | ICD-10-CM

## 2018-07-22 DIAGNOSIS — O0993 Supervision of high risk pregnancy, unspecified, third trimester: Secondary | ICD-10-CM

## 2018-07-22 DIAGNOSIS — Z3A37 37 weeks gestation of pregnancy: Secondary | ICD-10-CM

## 2018-07-22 DIAGNOSIS — O09523 Supervision of elderly multigravida, third trimester: Secondary | ICD-10-CM

## 2018-07-22 DIAGNOSIS — O0990 Supervision of high risk pregnancy, unspecified, unspecified trimester: Secondary | ICD-10-CM

## 2018-07-22 NOTE — Progress Notes (Signed)
Subjective:  Jane Walton is a 38 y.o. [phone] at [redacted]w[redacted]d being seen today for ongoing prenatal care.  She is currently monitored for the following issues for this high-risk pregnancy and has Supervision of high risk pregnancy, antepartum; Advanced maternal age in multigravida; Hypothyroidism affecting pregnancy; Constipation; Tinea versicolor; Hypothyroidism (acquired); Language barrier; and Vitamin D deficiency on their problem list.  Patient reports no complaints.  Contractions: Irregular. Vag. Bleeding: None.  Movement: Present. Denies leaking of fluid.   The following portions of the patient's history were reviewed and updated as appropriate: allergies, current medications, past family history, past medical history, past social history, past surgical history and problem list. Problem list updated.  Objective:   Vitals:   07/22/18 0956  BP: 119/73  Pulse: 87  Weight: 88.9 kg    Fetal Status: Fetal Heart Rate (bpm): 149   Movement: Present     General:  Alert, oriented and cooperative. Patient is in no acute distress.  Skin: Skin is warm and dry. No rash noted.   Cardiovascular: Normal heart rate noted  Respiratory: Normal respiratory effort, no problems with respiration noted  Abdomen: Soft, gravid, appropriate for gestational age. Pain/Pressure: Present     Pelvic:  Cervical exam deferred        Extremities: Normal range of motion.  Edema: None  Mental Status: Normal mood and affect. Normal behavior. Normal judgment and thought content.   Urinalysis:      Assessment and Plan:  Pregnancy: [id number] at [redacted]w[redacted]d  1. Supervision of high risk pregnancy, antepartum Stable GBS negative  2. Hypothyroidism affecting pregnancy in third trimester Stable Nl TSH 11/19  3. Multigravida of advanced maternal age in third trimester Stable  4. Language barrier Interrupter used during today's visit  Term labor symptoms and general obstetric precautions including but not limited to vaginal  bleeding, contractions, leaking of fluid and fetal movement were reviewed in detail with the patient. Please refer to After Visit Summary for other counseling recommendations.  Return in about 1 week (around 07/29/2018) for OB visit.   Hermina Staggers, MD

## 2018-07-29 ENCOUNTER — Ambulatory Visit (INDEPENDENT_AMBULATORY_CARE_PROVIDER_SITE_OTHER): Payer: Medicaid Other | Admitting: Advanced Practice Midwife

## 2018-07-29 VITALS — BP 121/73 | HR 81 | Wt 196.5 lb

## 2018-07-29 DIAGNOSIS — R000 Tachycardia, unspecified: Secondary | ICD-10-CM | POA: Insufficient documentation

## 2018-07-29 DIAGNOSIS — O0993 Supervision of high risk pregnancy, unspecified, third trimester: Secondary | ICD-10-CM

## 2018-07-29 DIAGNOSIS — O09523 Supervision of elderly multigravida, third trimester: Secondary | ICD-10-CM

## 2018-07-29 DIAGNOSIS — O99283 Endocrine, nutritional and metabolic diseases complicating pregnancy, third trimester: Secondary | ICD-10-CM

## 2018-07-29 DIAGNOSIS — Z789 Other specified health status: Secondary | ICD-10-CM

## 2018-07-29 DIAGNOSIS — O0990 Supervision of high risk pregnancy, unspecified, unspecified trimester: Secondary | ICD-10-CM

## 2018-07-29 DIAGNOSIS — E039 Hypothyroidism, unspecified: Secondary | ICD-10-CM

## 2018-07-29 NOTE — Progress Notes (Signed)
Pt states is feeling tired a lot & a lot of pressure in abdomen.

## 2018-07-29 NOTE — Progress Notes (Signed)
   PRENATAL VISIT NOTE  Subjective:  Jane Walton is a 38 y.o. [phone] at [redacted]w[redacted]d being seen today for ongoing prenatal care.  She is currently monitored for the following issues for this high-risk pregnancy and has Supervision of high risk pregnancy, antepartum; Advanced maternal age in multigravida; Hypothyroidism affecting pregnancy; Constipation; Tinea versicolor; Hypothyroidism (acquired); Language barrier; Vitamin D deficiency; and Tachycardia on their problem list.  Patient reports sensation of episides fast and slow heartbeat accompanied by SOB and brief,  sharp chest pain. X 1 week. Happens 0-2 x per day. Not happening now. Hx similar Sx a few years ago when she was Dx'd w/ Hypothyroidism . States she was hospitalized and her heart stopped and she had to be "shocked". F/U w/ cardiologist for ~3 months. Wore a monitor. No significant findings. Not instructed to continue w/ Cardiology per pt. Mo Meds or Tx recommended. Records not in Epic.  Contractions: Not present. Vag. Bleeding: None.  Movement: Present. Denies leaking of fluid.   The following portions of the patient's history were reviewed and updated as appropriate: allergies, current medications, past family history, past medical history, past social history, past surgical history and problem list. Problem list updated.  Objective:   Vitals:   07/29/18 1029  BP: 121/73  Pulse: 81  Weight: 196 lb 8 oz (89.1 kg)    Fetal Status: Fetal Heart Rate (bpm): 140 Fundal Height: 38 cm Movement: Present  Presentation: Vertex  General:  Alert, oriented and cooperative. Patient is in no acute distress.  Skin: Skin is warm and dry. No rash noted.   Cardiovascular: Normal heart rate noted  Respiratory: Normal respiratory effort, no problems with respiration noted  Abdomen: Soft, gravid, appropriate for gestational age.  Pain/Pressure: Present     Pelvic: Cervical exam declined        Extremities: Normal range of motion.  Edema: None    Mental Status: Normal mood and affect. Normal behavior. Normal judgment and thought content.   Assessment and Plan:  Pregnancy: [id number] at [redacted]w[redacted]d  1. Multigravida of advanced maternal age in third trimester   2. Hypothyroidism affecting pregnancy in third trimester - TSH - Ambulatory referral to Cardiology  3. Supervision of high risk pregnancy, antepartum  4. Language barrier - Live interpreter used   5. Tachycardia  - Declined to go to [facility] now. Precautions reviewed of when to go.  - Pt thinks she has records at home. Requested she bring them in. - CBC - Comprehensive metabolic panel - TSH - Magnesium - Ambulatory referral to Cardiology  Term labor symptoms and general obstetric precautions including but not limited to vaginal bleeding, contractions, leaking of fluid and fetal movement were reviewed in detail with the patient. Please refer to After Visit Summary for other counseling recommendations.  Return in about 1 week (around 08/05/2018).  No future appointments.  Dorathy Kinsman, CNM

## 2018-07-30 LAB — COMPREHENSIVE METABOLIC PANEL
ALBUMIN: 3.8 g/dL (ref 3.8–4.8)
ALT: 12 IU/L (ref 0–32)
AST: 10 IU/L (ref 0–40)
Albumin/Globulin Ratio: 1.3 (ref 1.2–2.2)
Alkaline Phosphatase: 131 IU/L — ABNORMAL HIGH (ref 39–117)
BILIRUBIN TOTAL: 0.3 mg/dL (ref 0.0–1.2)
BUN/Creatinine Ratio: 15 (ref 9–23)
BUN: 7 mg/dL (ref 6–20)
CALCIUM: 9.4 mg/dL (ref 8.7–10.2)
CHLORIDE: 101 mmol/L (ref 96–106)
CO2: 19 mmol/L — AB (ref 20–29)
Creatinine, Ser: 0.47 mg/dL — ABNORMAL LOW (ref 0.57–1.00)
GFR, EST AFRICAN AMERICAN: 147 mL/min/{1.73_m2} (ref >59)
GFR, EST NON AFRICAN AMERICAN: 127 mL/min/{1.73_m2} (ref >59)
GLUCOSE: 67 mg/dL (ref 65–99)
Globulin, Total: 2.9 g/dL (ref 1.5–4.5)
Potassium: 4.1 mmol/L (ref 3.5–5.2)
SODIUM: 137 mmol/L (ref 134–144)
Total Protein: 6.7 g/dL (ref 6.0–8.5)

## 2018-07-30 LAB — CBC
HEMOGLOBIN: 11.8 g/dL (ref 11.1–15.9)
Hematocrit: 35.4 % (ref 34.0–46.6)
MCH: 28.4 pg (ref 26.6–33.0)
MCHC: 33.3 g/dL (ref 31.5–35.7)
MCV: 85 fL (ref 79–97)
Platelets: 168 10*3/uL (ref 150–450)
RBC: 4.16 x10E6/uL (ref 3.77–5.28)
RDW: 12.1 % (ref 11.7–15.4)
WBC: 8.5 10*3/uL (ref 3.4–10.8)

## 2018-07-30 LAB — MAGNESIUM: Magnesium: 1.6 mg/dL (ref 1.6–2.3)

## 2018-07-30 LAB — TSH: TSH: 2.79 u[IU]/mL (ref 0.450–4.500)

## 2018-08-04 ENCOUNTER — Telehealth: Payer: Self-pay

## 2018-08-04 NOTE — Telephone Encounter (Signed)
-----   Message from [place] sent at 07/30/2018  1:34 PM EST ----- Please inform pt of normal blood work. No explanation for cardiac Sx. Still Needs Cardiology Referral.

## 2018-08-04 NOTE — Telephone Encounter (Signed)
Called pt with [employer] id# [phone] to advise of Cardiology appt with Dr. Charlton HawsPeter Nishan on 09/16/18 @ 8:15am on [address] No answer, left VM.

## 2018-08-04 NOTE — Progress Notes (Signed)
Cardiology appointment has been scheduled for 09/16/2018 @ 8:15am at office on [address].

## 2018-08-05 ENCOUNTER — Other Ambulatory Visit: Payer: Self-pay

## 2018-08-05 ENCOUNTER — Other Ambulatory Visit: Payer: Self-pay | Admitting: Advanced Practice Midwife

## 2018-08-05 DIAGNOSIS — R0602 Shortness of breath: Secondary | ICD-10-CM

## 2018-08-05 DIAGNOSIS — I519 Heart disease, unspecified: Secondary | ICD-10-CM

## 2018-08-05 DIAGNOSIS — Z3A39 39 weeks gestation of pregnancy: Secondary | ICD-10-CM

## 2018-08-05 DIAGNOSIS — O99413 Diseases of the circulatory system complicating pregnancy, third trimester: Secondary | ICD-10-CM

## 2018-08-05 NOTE — Progress Notes (Unsigned)
Can't get in with Cardiology prior to delivery. Needs Echo, EKG ASAP.

## 2018-08-09 ENCOUNTER — Encounter: Payer: Self-pay | Admitting: Advanced Practice Midwife

## 2018-08-11 ENCOUNTER — Inpatient Hospital Stay (HOSPITAL_COMMUNITY)
Admission: AD | Admit: 2018-08-11 | Discharge: 2018-08-13 | DRG: 807 | Disposition: A | Discharge status: Home / Self Care | Payer: Medicaid Other | Attending: Obstetrics & Gynecology | Admitting: Obstetrics & Gynecology

## 2018-08-11 ENCOUNTER — Inpatient Hospital Stay (HOSPITAL_COMMUNITY): Payer: Medicaid Other | Admitting: Anesthesiology

## 2018-08-11 ENCOUNTER — Encounter (HOSPITAL_COMMUNITY): Payer: Self-pay | Admitting: *Deleted

## 2018-08-11 ENCOUNTER — Other Ambulatory Visit: Payer: Self-pay

## 2018-08-11 DIAGNOSIS — O480 Post-term pregnancy: Secondary | ICD-10-CM

## 2018-08-11 DIAGNOSIS — O99280 Endocrine, nutritional and metabolic diseases complicating pregnancy, unspecified trimester: Secondary | ICD-10-CM

## 2018-08-11 DIAGNOSIS — O99284 Endocrine, nutritional and metabolic diseases complicating childbirth: Secondary | ICD-10-CM | POA: Diagnosis present

## 2018-08-11 DIAGNOSIS — O0990 Supervision of high risk pregnancy, unspecified, unspecified trimester: Secondary | ICD-10-CM

## 2018-08-11 DIAGNOSIS — O4290 Premature rupture of membranes, unspecified as to length of time between rupture and onset of labor, unspecified weeks of gestation: Secondary | ICD-10-CM | POA: Diagnosis present

## 2018-08-11 DIAGNOSIS — Z789 Other specified health status: Secondary | ICD-10-CM | POA: Diagnosis present

## 2018-08-11 DIAGNOSIS — R000 Tachycardia, unspecified: Secondary | ICD-10-CM | POA: Diagnosis present

## 2018-08-11 DIAGNOSIS — O4292 Full-term premature rupture of membranes, unspecified as to length of time between rupture and onset of labor: Principal | ICD-10-CM | POA: Diagnosis present

## 2018-08-11 DIAGNOSIS — Z603 Acculturation difficulty: Secondary | ICD-10-CM | POA: Diagnosis present

## 2018-08-11 DIAGNOSIS — Z3A40 40 weeks gestation of pregnancy: Secondary | ICD-10-CM

## 2018-08-11 DIAGNOSIS — E039 Hypothyroidism, unspecified: Secondary | ICD-10-CM | POA: Diagnosis present

## 2018-08-11 DIAGNOSIS — O09529 Supervision of elderly multigravida, unspecified trimester: Secondary | ICD-10-CM

## 2018-08-11 HISTORY — DX: Cardiac arrest, cause unspecified: I46.9

## 2018-08-11 HISTORY — DX: Hypothyroidism, unspecified: E03.9

## 2018-08-11 LAB — CBC
HCT: 36.6 % (ref 36.0–46.0)
Hemoglobin: 11.9 g/dL — ABNORMAL LOW (ref 12.0–15.0)
MCH: 29 pg (ref 26.0–34.0)
MCHC: 32.5 g/dL (ref 30.0–36.0)
MCV: 89.1 fL (ref 80.0–100.0)
Platelets: 181 10*3/uL (ref 150–400)
RBC: 4.11 MIL/uL (ref 3.87–5.11)
RDW: 13.3 % (ref 11.5–15.5)
WBC: 9.6 10*3/uL (ref 4.0–10.5)
nRBC: 0 % (ref 0.0–0.2)

## 2018-08-11 LAB — TYPE AND SCREEN
ABO/RH(D): A POS
Antibody Screen: NEGATIVE

## 2018-08-11 LAB — ABO/RH: ABO/RH(D): A POS

## 2018-08-11 LAB — HEPATITIS B SURFACE ANTIGEN: HEP B S AG: NEGATIVE

## 2018-08-11 LAB — RPR: RPR Ser Ql: NONREACTIVE

## 2018-08-11 LAB — POCT FERN TEST: POCT Fern Test: POSITIVE

## 2018-08-11 MED ORDER — FLEET ENEMA 7-19 GM/118ML RE ENEM: 1.0000 | ENEMA | RECTAL | Status: DC | PRN

## 2018-08-11 MED ORDER — ACETAMINOPHEN 325 MG PO TABS: 650.0000 mg | ORAL_TABLET | ORAL | Status: DC | PRN

## 2018-08-11 MED ORDER — LACTATED RINGERS IV SOLN
INTRAVENOUS | Status: DC
  Administered 2018-08-11 (×3): via INTRAVENOUS

## 2018-08-11 MED ORDER — LIDOCAINE HCL (PF) 1 % IJ SOLN
INTRAMUSCULAR | Status: DC | PRN
  Administered 2018-08-11 (×2): 4 mL via EPIDURAL

## 2018-08-11 MED ORDER — OXYTOCIN 40 UNITS IN NORMAL SALINE INFUSION - SIMPLE MED
2.5000 [IU]/h | INTRAVENOUS | Status: DC
  Administered 2018-08-11: 2.5 [IU]/h via INTRAVENOUS
  Filled 2018-08-11: qty 1000

## 2018-08-11 MED ORDER — LACTATED RINGERS IV SOLN: 500.0000 mL | Freq: Once | INTRAVENOUS | Status: DC

## 2018-08-11 MED ORDER — OXYCODONE-ACETAMINOPHEN 5-325 MG PO TABS: 1.0000 | ORAL_TABLET | ORAL | Status: DC | PRN

## 2018-08-11 MED ORDER — OXYCODONE-ACETAMINOPHEN 5-325 MG PO TABS: 2.0000 | ORAL_TABLET | ORAL | Status: DC | PRN

## 2018-08-11 MED ORDER — MISOPROSTOL 25 MCG QUARTER TABLET
25.0000 ug | ORAL_TABLET | ORAL | Status: DC | PRN
  Administered 2018-08-11: 25 ug via VAGINAL
  Filled 2018-08-11 (×3): qty 1

## 2018-08-11 MED ORDER — DIPHENHYDRAMINE HCL 50 MG/ML IJ SOLN: 12.5000 mg | INTRAMUSCULAR | Status: DC | PRN

## 2018-08-11 MED ORDER — EPHEDRINE 5 MG/ML INJ
10.0000 mg | INTRAVENOUS | Status: DC | PRN
  Filled 2018-08-11: qty 2

## 2018-08-11 MED ORDER — MISOPROSTOL 50MCG HALF TABLET
50.0000 ug | ORAL_TABLET | ORAL | Status: DC | PRN
  Administered 2018-08-11: 50 ug via ORAL
  Filled 2018-08-11 (×3): qty 1

## 2018-08-11 MED ORDER — ONDANSETRON HCL 4 MG/2ML IJ SOLN: 4.0000 mg | Freq: Four times a day (QID) | INTRAMUSCULAR | Status: DC | PRN

## 2018-08-11 MED ORDER — SOD CITRATE-CITRIC ACID 500-334 MG/5ML PO SOLN: 30.0000 mL | ORAL | Status: DC | PRN

## 2018-08-11 MED ORDER — LEVOTHYROXINE SODIUM 88 MCG PO TABS
88.0000 ug | ORAL_TABLET | Freq: Every day | ORAL | Status: DC
  Filled 2018-08-11 (×2): qty 1

## 2018-08-11 MED ORDER — FENTANYL CITRATE (PF) 100 MCG/2ML IJ SOLN: 50.0000 ug | INTRAMUSCULAR | Status: DC | PRN

## 2018-08-11 MED ORDER — HYDROXYZINE HCL 50 MG PO TABS
50.0000 mg | ORAL_TABLET | Freq: Four times a day (QID) | ORAL | Status: DC | PRN
  Filled 2018-08-11: qty 1

## 2018-08-11 MED ORDER — FENTANYL 2.5 MCG/ML BUPIVACAINE 1/10 % EPIDURAL INFUSION (WH - ANES)
14.0000 mL/h | INTRAMUSCULAR | Status: DC | PRN
  Administered 2018-08-11: 14 mL/h via EPIDURAL
  Filled 2018-08-11: qty 100

## 2018-08-11 MED ORDER — OXYTOCIN 40 UNITS IN NORMAL SALINE INFUSION - SIMPLE MED
1.0000 m[IU]/min | INTRAVENOUS | Status: DC
  Administered 2018-08-11: 2 m[IU]/min via INTRAVENOUS

## 2018-08-11 MED ORDER — PHENYLEPHRINE 40 MCG/ML (10ML) SYRINGE FOR IV PUSH (FOR BLOOD PRESSURE SUPPORT)
80.0000 ug | PREFILLED_SYRINGE | INTRAVENOUS | Status: DC | PRN
  Filled 2018-08-11 (×2): qty 10

## 2018-08-11 MED ORDER — LIDOCAINE HCL (PF) 1 % IJ SOLN
30.0000 mL | INTRAMUSCULAR | Status: DC | PRN
  Filled 2018-08-11: qty 30

## 2018-08-11 MED ORDER — OXYTOCIN BOLUS FROM INFUSION
500.0000 mL | Freq: Once | INTRAVENOUS | Status: AC
  Administered 2018-08-11: 500 mL via INTRAVENOUS

## 2018-08-11 MED ORDER — TERBUTALINE SULFATE 1 MG/ML IJ SOLN
0.2500 mg | Freq: Once | INTRAMUSCULAR | Status: DC | PRN
  Filled 2018-08-11: qty 1

## 2018-08-11 MED ORDER — PHENYLEPHRINE 40 MCG/ML (10ML) SYRINGE FOR IV PUSH (FOR BLOOD PRESSURE SUPPORT)
80.0000 ug | PREFILLED_SYRINGE | INTRAVENOUS | Status: DC | PRN
  Filled 2018-08-11: qty 10

## 2018-08-11 MED ORDER — LACTATED RINGERS IV SOLN
500.0000 mL | INTRAVENOUS | Status: DC | PRN
  Administered 2018-08-11: 500 mL via INTRAVENOUS

## 2018-08-11 NOTE — Progress Notes (Signed)
Patient ID: Jane Walton, female   DOB: 06-11-1981, 38 y.o.   MRN: [id number] Pt comfortable, no complaints SVE 0.5/th/ballotable, vtx by RN Cat 1FHR Irregular mild uc's Pt wants to begin IOL Will start cytotec po q 4hr Dr. Adrian Blackwater aware of cardiac hx Cheral Marker, CNM, [facility] 08/11/2018 6:31 AM

## 2018-08-11 NOTE — Progress Notes (Signed)
OB/GYN Faculty Practice: Labor Progress Note  *Telephone interpreter used for encounter* Subjective: Doing well, feeling more uncomfortable now that pitocin started.   Objective: BP (!) 109/54   Pulse 79   Temp 98.4 F (36.9 C) (Axillary)   Resp 18   Ht 5\' 4"  (1.626 m)   Wt 91.6 kg   LMP 10/25/2017   BMI 34.67 kg/m  Gen: well-appearing, uncomfortable during contractions Dilation: 4 Effacement (%): 50 Cervical Position: Middle Station: -2 Presentation: Vertex Exam by:: lee  Assessment and Plan: 38 y.o. [id number] [redacted]w[redacted]d here for IOL for PROM.  Labor: Induction started this morning with FB and cytotec. Received two doses then switched to pitocin.  -- pain control: desires epidural -- PPH Risk: low  Fetal Well-Being: EFW 7-8lbs by Leopold's. Cephalic by prior checks.  -- Category I - continuous fetal monitoring  -- GBS negative    Jane Voigt S. Earlene Plater, DO OB/GYN Fellow, Faculty Practice  4:24 PM

## 2018-08-11 NOTE — Anesthesia Procedure Notes (Addendum)
Epidural Patient location during procedure: OB Start time: 08/11/2018 5:39 PM End time: 08/11/2018 5:48 PM  Staffing Anesthesiologist: Mal Amabile, MD Performed: anesthesiologist   Preanesthetic Checklist Completed: patient identified, site marked, surgical consent, pre-op evaluation, timeout performed, IV checked, risks and benefits discussed and monitors and equipment checked  Epidural Patient position: sitting Prep: site prepped and draped and DuraPrep Patient monitoring: continuous pulse ox and blood pressure Approach: midline Location: L3-L4 Injection technique: LOR air  Needle:  Needle type: Tuohy  Needle gauge: 17 G Needle length: 9 cm and 9 Needle insertion depth: 5 cm cm Catheter type: closed end flexible Catheter size: 19 Gauge Catheter at skin depth: 10 cm Test dose: negative and Other  Assessment Events: blood not aspirated, injection not painful, no injection resistance, negative IV test and no paresthesia  Additional Notes Patient identified. Risks and benefits discussed including failed block, incomplete  Pain control, post dural puncture headache, nerve damage, paralysis, blood pressure Changes, nausea, vomiting, reactions to medications-both toxic and allergic and post Partum back pain. All questions were answered. Patient expressed understanding and wished to proceed. Sterile technique was used throughout procedure. Epidural site was Dressed with sterile barrier dressing. No paresthesias, signs of intravascular injection Or signs of intrathecal spread were encountered.  Patient was more comfortable after the epidural was dosed. Please see RN's note for documentation of vital signs and FHR which are stable. Reason for block:procedure for pain

## 2018-08-11 NOTE — Progress Notes (Signed)
LABOR PROGRESS NOTE  Ermer Clatterbuck is a 38 y.o. [id number] at [redacted]w[redacted]d  admitted for SROM at 0115 2/12.  Subjective: Patient is resting comfortably with epidural in place. Pitocin running. Endorses feeling "like the baby is moving up" with contractions. Pain worse in the LUQ.   Objective: BP (!) 94/49   Pulse 72   Temp 98.5 F (36.9 C) (Oral)   Resp 16   Ht 5\' 4"  (1.626 m)   Wt 91.6 kg   LMP 10/25/2017   BMI 34.67 kg/m  or  Vitals:   08/11/18 1830 08/11/18 1930 08/11/18 2000 08/11/18 2030  BP: (!) 107/57 (!) 94/49    Pulse: 75 72    Resp:  16 16 16   Temp:    98.5 F (36.9 C)  TempSrc:    Oral  Weight:      Height:         Dilation: 4 Effacement (%): 50 Cervical Position: Middle Station: -1 Presentation: Vertex Exam by:: lee FHT: baseline rate 140, moderate varibility, 15x15 acel, no decel Toco: ctx q2-3 min  Labs: Lab Results  Component Value Date   WBC 9.6 08/11/2018   HGB 11.9 (L) 08/11/2018   HCT 36.6 08/11/2018   MCV 89.1 08/11/2018   PLT 181 08/11/2018    Patient Active Problem List   Diagnosis Date Noted  . Amniotic fluid leaking 08/11/2018  . PROM (premature rupture of membranes) 08/11/2018  . Tachycardia 07/29/2018  . Supervision of high risk pregnancy, antepartum 02/01/2018  . Advanced maternal age in multigravida 02/01/2018  . Hypothyroidism affecting pregnancy 02/01/2018  . Language barrier 02/01/2018  . Vitamin D deficiency 02/01/2018  . Constipation 01/02/2018  . Hypothyroidism (acquired) 10/15/2017    Assessment / Plan: 38 y.o. [id number] at [redacted]w[redacted]d here for SROM at 0115 2/12.  Labor: latent, s/p FB and cytotec x2, pitocin running Fetal Wellbeing:  Cat 1 Pain Control:  epidural Anticipated MOD:  SVD  Xan Niyana Chesbro, D.O.  08/11/2018, 9:26 PM

## 2018-08-11 NOTE — Anesthesia Preprocedure Evaluation (Signed)
Anesthesia Evaluation  Patient identified by MRN, date of birth, ID band Patient awake    Reviewed: Allergy & Precautions, Patient's Chart, lab work & pertinent test results  Airway Mallampati: II  TM Distance: >3 FB Neck ROM: Full    Dental no notable dental hx. (+) Teeth Intact   Pulmonary neg pulmonary ROS,    Pulmonary exam normal breath sounds clear to auscultation       Cardiovascular Normal cardiovascular exam Rhythm:Regular Rate:Normal  Hx/o Cardiac arrest 20 months ago due to hypothyroidism   Neuro/Psych negative neurological ROS  negative psych ROS   GI/Hepatic Neg liver ROS, GERD  ,  Endo/Other  Hypothyroidism   Renal/GU negative Renal ROS  negative genitourinary   Musculoskeletal negative musculoskeletal ROS (+)   Abdominal (+) + obese,   Peds  Hematology   Anesthesia Other Findings   Reproductive/Obstetrics (+) Pregnancy                             Anesthesia Physical Anesthesia Plan  ASA: II  Anesthesia Plan: Epidural   Post-op Pain Management:    Induction:   PONV Risk Score and Plan:   Airway Management Planned: Natural Airway  Additional Equipment:   Intra-op Plan:   Post-operative Plan:   Informed Consent: I have reviewed the patients History and Physical, chart, labs and discussed the procedure including the risks, benefits and alternatives for the proposed anesthesia with the patient or authorized representative who has indicated his/her understanding and acceptance.       Plan Discussed with: Anesthesiologist  Anesthesia Plan Comments:         Anesthesia Quick Evaluation

## 2018-08-11 NOTE — Progress Notes (Signed)
LABOR PROGRESS NOTE  Jane Walton is a 38 y.o. [id number] at [redacted]w[redacted]d admitted for PROM.  Subjective: Patient seen resting comfortably in bed. She states she was last seen for her heart condition 1year and 59months ago in [place], [place], at [facility]. She does not know what her heart condition is. She signed consent for her medical records to be translated and consented to Foley Bulb and vaginal cytotec placement.  Speak and Translate App was used to translate this encounter as the iPad translator was not working.  Objective: BP 105/65   Pulse 76   Temp 98.6 F (37 C) (Oral)   Resp 16   Ht 5\' 4"  (1.626 m)   Wt 91.6 kg   LMP 10/25/2017   BMI 34.67 kg/m  Vitals:   08/11/18 0724 08/11/18 0840 08/11/18 0943 08/11/18 1032  BP:  (!) 107/58 115/70 105/65  Pulse:  75 77 76  Resp:      Temp: 98.6 F (37 C)     TempSrc: Oral     Weight:      Height:       Dilation: Fingertip Effacement (%): Thick Station: Ballotable Presentation: Vertex Exam by:: RN Barnicle FHT: baseline rate 130, moderate varibility, 10x10 accel, none decel Toco: q. 10 minutes  Labs: Lab Results  Component Value Date   WBC 9.6 08/11/2018   HGB 11.9 (L) 08/11/2018   HCT 36.6 08/11/2018   MCV 89.1 08/11/2018   PLT 181 08/11/2018    Patient Active Problem List   Diagnosis Date Noted  . Amniotic fluid leaking 08/11/2018  . PROM (premature rupture of membranes) 08/11/2018  . Tachycardia 07/29/2018  . Supervision of high risk pregnancy, antepartum 02/01/2018  . Advanced maternal age in multigravida 02/01/2018  . Hypothyroidism affecting pregnancy 02/01/2018  . Language barrier 02/01/2018  . Vitamin D deficiency 02/01/2018  . Constipation 01/02/2018  . Hypothyroidism (acquired) 10/15/2017    Assessment / Plan: 38 y.o. [id number] at [redacted]w[redacted]d here for PROM.  Labor: Latent, s/p Cytotec, place FB Fetal Wellbeing:  Category 1 Pain Control:  Well-controlled, IV pain meds and  epidural PRN active labor Anticipated MOD:  SVD  Peggyann Shoals, DO [facility], PGY-1 08/11/2018 11:04 AM

## 2018-08-11 NOTE — Discharge Summary (Addendum)
OB Discharge Summary     Patient Name: Jane Walton DOB: [birth year] MRN: [id number]  Date of admission: 08/11/2018 Delivering MD: Standley Brooking   Date of discharge: 08/12/2018  Admitting diagnosis: 41WKS WATER BROKE Intrauterine pregnancy: [redacted]w[redacted]d     Secondary diagnosis:  Principal Problem:   PROM (premature rupture of membranes) Active Problems:   Advanced maternal age in multigravida   Hypothyroidism affecting pregnancy   Language barrier   Tachycardia   Amniotic fluid leaking  Additional problems: none     Discharge diagnosis: Term Pregnancy Delivered                                                                                                Post partum procedures:none  Augmentation: Pitocin, Cytotec and Foley Balloon  Complications: None  Hospital course:  Induction of Labor With Vaginal Delivery   38 y.o. yo Y1V4944 at [redacted]w[redacted]d was admitted to the hospital 08/11/2018 for induction of labor.  Indication for induction: PROM.  Patient had an uncomplicated labor course as follows: Membrane Rupture Time/Date: 3:00 AM ,08/11/2018   Intrapartum Procedures: Episiotomy: None [1]                                         Lacerations:  1st degree [2]  Patient had delivery of a Viable infant.  Information for the patient's newborn:  Mindel, Verhel[place] Girl Katrell [[id number]]  Delivery Method: Vaginal, Spontaneous(Filed from Delivery Summary)   08/11/2018  Details of delivery can be found in separate delivery note.  Patient had a routine postpartum course. Patient is discharged home 08/12/18.  Physical exam  Vitals:   08/12/18 0101 08/12/18 0123 08/12/18 0235 08/12/18 0529  BP: 111/64 110/64 (!) 113/55 114/75  Pulse: 90 81 89 95  Resp: 16 16 18 17   Temp:  98.3 F (36.8 C) 98.2 F (36.8 C) 98.6 F (37 C)  TempSrc:  Oral Oral Oral  SpO2:   99% 99%  Weight:      Height:       General: alert, cooperative and no distress Lochia: appropriate Uterine Fundus: firm Incision:  N/A DVT Evaluation: No evidence of DVT seen on physical exam. Labs: Lab Results  Component Value Date   WBC 9.6 08/11/2018   HGB 11.9 (L) 08/11/2018   HCT 36.6 08/11/2018   MCV 89.1 08/11/2018   PLT 181 08/11/2018   CMP Latest Ref Rng & Units 07/29/2018  Glucose 65 - 99 mg/dL 67  BUN 6 - 20 mg/dL 7  Creatinine 4.66 - 5.99 mg/dL 3.57(S)  Sodium 177 - 939 mmol/L 137  Potassium 3.5 - 5.2 mmol/L 4.1  Chloride 96 - 106 mmol/L 101  CO2 20 - 29 mmol/L 19(L)  Calcium 8.7 - 10.2 mg/dL 9.4  Total Protein 6.0 - 8.5 g/dL 6.7  Total Bilirubin 0.0 - 1.2 mg/dL 0.3  Alkaline Phos 39 - 117 IU/L 131(H)  AST 0 - 40 IU/L 10  ALT 0 - 32 IU/L 12    Discharge instruction: per After  Visit Summary and "Baby and Me Booklet".  After visit meds:  Allergies as of 08/12/2018      Reactions   Meat [alpha-gal] Shortness Of Breath, Itching   Red meat   Milk-related Compounds Shortness Of Breath, Itching      Medication List    TAKE these medications   acetaminophen 325 MG tablet Commonly known as:  TYLENOL Take 2 tablets (650 mg total) by mouth every 4 (four) hours as needed (for pain scale < 4).   benzocaine-Menthol 20-0.5 % Aero Commonly known as:  DERMOPLAST Apply 1 application topically as needed for irritation (perineal discomfort).   calcium-vitamin D 500-200 MG-UNIT tablet Commonly known as:  OSCAL WITH D Take 1 tablet by mouth 2 (two) times daily.   dibucaine 1 % Oint Commonly known as:  NUPERCAINAL Please give patient 60 gram tube   ibuprofen 600 MG tablet Commonly known as:  ADVIL,MOTRIN Take 1 tablet (600 mg total) by mouth every 6 (six) hours.   levothyroxine 88 MCG tablet Commonly known as:  SYNTHROID, LEVOTHROID Take 1 tablet (88 mcg total) by mouth daily at 6 (six) AM. Start taking on:  August 13, 2018 What changed:    how much to take  how to take this  when to take this  additional instructions   prenatal multivitamin Tabs tablet Take 1 tablet by mouth daily  at 12 noon.   senna-docusate 8.6-50 MG tablet Commonly known as:  Senokot-S Take 2 tablets by mouth daily.   witch hazel-glycerin pad Commonly known as:  TUCKS Apply 1 application topically as needed for hemorrhoids.       Diet: routine diet  Activity: Advance as tolerated. Pelvic rest for 6 weeks.   Outpatient follow up:6 weeks Follow up Appt: Future Appointments  Date Time Provider Department Center  08/13/2018  8:15 AM Allie Bossierove, Myra C, MD [facility]  09/16/2018  8:15 AM Wendall StadeNishan, Peter C, MD [facility]   Follow up Visit:No follow-ups on file.  Postpartum contraception: IUD [place]yla and pap pp  Newborn Data: Live born female  Birth Weight:   APGAR: 8, 9  Newborn Delivery   Birth date/time:  08/11/2018 23:12:00 Delivery type:  Vaginal, Spontaneous     Baby Feeding: Breast Disposition:home with mother Mother is medically stable and able to be discharged late tonight on 2-13 if she would like.   08/12/2018 Marylene LandKathryn Lorraine , CNM

## 2018-08-11 NOTE — H&P (Signed)
Jane Walton is a 38 y.o. [phone] female at [redacted]w[redacted]d by 8wk u/s, presenting w/ SROM clear fluid at 0115.   Reports active fetal movement, contractions: irregular, vaginal bleeding: none, membranes: ruptured, clear fluid. Initiated prenatal care at [facility] at 13 wks.  Denies any current chest pain, sob, tachycardia.   This pregnancy complicated by: AMA, hypothyroidism on synthroid, language barrier Seen 1/30 at clinic for episodes fast and slow heartbeat w/ SOB and CP, sx similar to when dx w/ hypothyroidism- was hospitalized 79yr & ago 'heart stopped and had to be shocked' f/u w/ cards x , then no further f/u. Labs ordered 1/30 normal, has pending cards referral/appt 3/19  Prenatal History/Complications:  SAB x 2, 3 term uncomplicated SVBs- last in 2011  Past Medical History: Past Medical History:  Diagnosis Date  . Cardiac arrest [facility])    patient reports it is secondary to hypothyroidism   . Hypothyroidism    pt reports was admitted to hospital due to low blood pressure; her lack of thyroid caused her heart to stop and reports having cardiac arrest  . Thyroid disease     Past Surgical History: Past Surgical History:  Procedure Laterality Date  . NO PAST SURGERIES      Obstetrical History: OB History    Gravida  6   Para  3   Term  3   Preterm  0   AB  2   Living  3     SAB  2   TAB  0   Ectopic  0   Multiple  0   Live Births  3           Social History: Social History   Socioeconomic History  . Marital status: Married    Spouse name: Not on file  . Number of children: Not on file  . Years of education: Not on file  . Highest education level: Not on file  Occupational History  . Not on file  Social Needs  . Financial resource strain: Not on file  . Food insecurity:    Worry: Not on file    Inability: Not on file  . Transportation needs:    Medical: Not on file    Non-medical: Not on file  Tobacco Use  . Smoking status: Never Smoker   . Smokeless tobacco: Never Used  Substance and Sexual Activity  . Alcohol use: Never    Frequency: Never  . Drug use: Never  . Sexual activity: Yes  Lifestyle  . Physical activity:    Days per week: Not on file    Minutes per session: Not on file  . Stress: Not on file  Relationships  . Social connections:    Talks on phone: Not on file    Gets together: Not on file    Attends religious service: Not on file    Active member of club or organization: Not on file    Attends meetings of clubs or organizations: Not on file    Relationship status: Not on file  Other Topics Concern  . Not on file  Social History Narrative  . Not on file    Family History: History reviewed. No pertinent family history.  Allergies: Allergies  Allergen Reactions  . Meat [Alpha-Gal] Shortness Of Breath and Itching    Red meat  . Milk-Related Compounds Shortness Of Breath and Itching    Medications Prior to Admission  Medication Sig Dispense Refill Last Dose  . calcium-vitamin D (OSCAL WITH  D) 500-200 MG-UNIT tablet Take 1 tablet by mouth 2 (two) times daily. 60 tablet 3 08/10/2018 at Unknown time  . levothyroxine (SYNTHROID, LEVOTHROID) 88 MCG tablet TAKE 1 TABLET (88 MCG TOTAL) BY MOUTH DAILY AT 0600. 90 tablet 4 08/10/2018 at Unknown time  . Prenatal Vit-Fe Fumarate-FA (MULTIVITAMIN-PRENATAL) 27-0.8 MG TABS tablet Take 1 tablet by mouth daily at 12 noon.   08/10/2018 at Unknown time    Review of Systems  Pertinent pos/neg as indicated in HPI  Blood pressure 120/71, pulse 83, temperature 98.2 F (36.8 C), temperature source Oral, resp. rate 20, height 5\' 4"  (1.626 m), weight 91.6 kg, last menstrual period 10/25/2017. General appearance: alert, cooperative and no distress Lungs: clear to auscultation bilaterally Heart: regular rate and rhythm Abdomen: gravid, soft, non-tender Extremities: tr edema DTR's 2+  Fetal monitoring: FHR: 130 bpm, variability: moderate,  Accelerations: Present,   decelerations:  Absent Uterine activity: q 3-81mins, not felt by pt   Presentation: cephalic by informal bs u/s   Prenatal labs: ABO, Rh:  don't see in EPIC Antibody:  don't see in EPIC Rubella: 9.06 (11/14 0913) RPR: Non Reactive (11/14 0857)  HBsAg:   don't see in EPIC HIV: Non Reactive (11/14 0857)  GBS:   neg  2hr GTT: normal Genetic screening:  Low-risk NIPT Anatomy US: normal female  Results for orders placed or performed during the hospital encounter of 08/11/18 (from the past 24 hour(s))  Fern Test   Collection Time: 08/11/18  4:37 AM  Result Value Ref Range   POCT Fern Test Positive = ruptured amniotic membanes   CBC   Collection Time: 08/11/18  5:05 AM  Result Value Ref Range   WBC 9.6 4.0 - 10.5 K/uL   RBC 4.11 3.87 - 5.11 MIL/uL   Hemoglobin 11.9 (L) 12.0 - 15.0 g/dL   HCT 73.4 19.3 - 79.0 %   MCV 89.1 80.0 - 100.0 fL   MCH 29.0 26.0 - 34.0 pg   MCHC 32.5 30.0 - 36.0 g/dL   RDW 24.0 97.3 - 53.2 %   Platelets 181 150 - 400 K/uL   nRBC 0.0 0.0 - 0.2 %     Assessment:  [redacted]w[redacted]d SIUP  [id number]  PROM  Cat 1 FHR  GBS  neg  AMA  H/O uncertain cardiac problems/possible arrest  Plan:  Admit to BS  IV pain meds/epidural prn active labor  Expectant management  Anticipate NSVB   Plans to breast & bottlefeed  Contraception: IUD  Circumcision: n/a  Will get EKG for ?cardiac hx- will try to get records from [facility] CNM, [facility] 08/11/2018, 5:27 AM

## 2018-08-11 NOTE — MAU Note (Signed)
PT DOESN'T SPEAK ENGLISH- SPEAKS  ARABIC-   HASSAN- [id number].  SAYS SROM AT 0115- YES . PNC WITH IN CLINIC.   VE  AT 36 WEEKS - CLOSED .   UC'S  - MILD  - OCC.    DENIES HSV AND MRSA. GBS- NEG.Marland Kitchen

## 2018-08-11 NOTE — Progress Notes (Signed)
[employer] interpre[employer] used via phone. Arabic translator ID number: [phone]

## 2018-08-12 ENCOUNTER — Encounter (HOSPITAL_COMMUNITY): Payer: Self-pay

## 2018-08-12 MED ORDER — TETANUS-DIPHTH-ACELL PERTUSSIS 5-2.5-18.5 LF-MCG/0.5 IM SUSP: 0.5000 mL | Freq: Once | INTRAMUSCULAR | Status: AC

## 2018-08-12 MED ORDER — IBUPROFEN 600 MG PO TABS
600.0000 mg | ORAL_TABLET | Freq: Four times a day (QID) | ORAL | Status: DC
  Administered 2018-08-12 – 2018-08-13 (×4): 600 mg via ORAL
  Filled 2018-08-12 (×5): qty 1

## 2018-08-12 MED ORDER — ONDANSETRON HCL 4 MG PO TABS: 4.0000 mg | ORAL_TABLET | ORAL | Status: DC | PRN

## 2018-08-12 MED ORDER — ACETAMINOPHEN 325 MG PO TABS: 650.0000 mg | ORAL_TABLET | ORAL | 1 refills | Status: AC | PRN

## 2018-08-12 MED ORDER — PRENATAL MULTIVITAMIN CH
1.0000 | ORAL_TABLET | Freq: Every day | ORAL | Status: DC
  Administered 2018-08-12: 1 via ORAL
  Filled 2018-08-12: qty 1

## 2018-08-12 MED ORDER — SENNOSIDES-DOCUSATE SODIUM 8.6-50 MG PO TABS: 2.0000 | ORAL_TABLET | ORAL | 1 refills | Status: AC

## 2018-08-12 MED ORDER — SIMETHICONE 80 MG PO CHEW: 80.0000 mg | CHEWABLE_TABLET | ORAL | Status: DC | PRN

## 2018-08-12 MED ORDER — LEVOTHYROXINE SODIUM 88 MCG PO TABS: 88.0000 ug | ORAL_TABLET | Freq: Every day | ORAL | 3 refills | Status: AC

## 2018-08-12 MED ORDER — ACETAMINOPHEN 325 MG PO TABS: 650.0000 mg | ORAL_TABLET | ORAL | Status: DC | PRN

## 2018-08-12 MED ORDER — WITCH HAZEL-GLYCERIN EX PADS: 1.0000 "application " | MEDICATED_PAD | CUTANEOUS | Status: DC | PRN

## 2018-08-12 MED ORDER — LEVOTHYROXINE SODIUM 88 MCG PO TABS
88.0000 ug | ORAL_TABLET | Freq: Every day | ORAL | Status: DC
  Administered 2018-08-12 – 2018-08-13 (×2): 88 ug via ORAL
  Filled 2018-08-12 (×3): qty 1

## 2018-08-12 MED ORDER — DIPHENHYDRAMINE HCL 25 MG PO CAPS: 25.0000 mg | ORAL_CAPSULE | Freq: Four times a day (QID) | ORAL | Status: DC | PRN

## 2018-08-12 MED ORDER — WITCH HAZEL-GLYCERIN EX PADS: 1.0000 "application " | MEDICATED_PAD | CUTANEOUS | 12 refills | Status: AC | PRN

## 2018-08-12 MED ORDER — IBUPROFEN 600 MG PO TABS: 600.0000 mg | ORAL_TABLET | Freq: Four times a day (QID) | ORAL | 0 refills | Status: AC

## 2018-08-12 MED ORDER — BENZOCAINE-MENTHOL 20-0.5 % EX AERO: 1.0000 "application " | INHALATION_SPRAY | CUTANEOUS | 0 refills | Status: AC | PRN

## 2018-08-12 MED ORDER — DIBUCAINE 1 % RE OINT: 1.0000 "application " | TOPICAL_OINTMENT | RECTAL | Status: DC | PRN

## 2018-08-12 MED ORDER — ONDANSETRON HCL 4 MG/2ML IJ SOLN: 4.0000 mg | INTRAMUSCULAR | Status: DC | PRN

## 2018-08-12 MED ORDER — BENZOCAINE-MENTHOL 20-0.5 % EX AERO: 1.0000 "application " | INHALATION_SPRAY | CUTANEOUS | Status: DC | PRN

## 2018-08-12 MED ORDER — PRENATAL MULTIVITAMIN CH: 1.0000 | ORAL_TABLET | Freq: Every day | ORAL | 1 refills | Status: AC

## 2018-08-12 MED ORDER — DIBUCAINE 1 % RE OINT: TOPICAL_OINTMENT | RECTAL | 1 refills | Status: AC

## 2018-08-12 MED ORDER — SENNOSIDES-DOCUSATE SODIUM 8.6-50 MG PO TABS
2.0000 | ORAL_TABLET | ORAL | Status: DC
  Administered 2018-08-12: 2 via ORAL
  Filled 2018-08-12: qty 2

## 2018-08-12 MED ORDER — COCONUT OIL OIL
1.0000 "application " | TOPICAL_OIL | Status: DC | PRN
  Filled 2018-08-12: qty 120

## 2018-08-12 NOTE — Anesthesia Postprocedure Evaluation (Signed)
Anesthesia Post Note  Patient: Jane Walton  Procedure(s) Performed: AN AD HOC LABOR EPIDURAL     Patient location during evaluation: Mother Baby Anesthesia Type: Epidural Level of consciousness: awake and alert and oriented Pain management: satisfactory to patient Vital Signs Assessment: post-procedure vital signs reviewed and stable Respiratory status: respiratory function stable Cardiovascular status: stable Postop Assessment: no headache, no backache, epidural receding, patient able to bend at knees, no signs of nausea or vomiting and adequate PO intake Anesthetic complications: no    Last Vitals:  Vitals:   08/12/18 0235 08/12/18 0529  BP: (!) 113/55 114/75  Pulse: 89 95  Resp: 18 17  Temp: 36.8 C 37 C  SpO2: 99% 99%    Last Pain:  Vitals:   08/12/18 0529  TempSrc: Oral  PainSc: 0-No pain   Pain Goal:                   Joline Encalada

## 2018-08-12 NOTE — Progress Notes (Signed)
Pt request synthroid medication. Provider contacted waiting for order

## 2018-08-12 NOTE — Lactation Note (Signed)
This note was copied from a baby's chart. Lactation Consultation Note  Patient Name: Jane Walton [id number] Date: 08/12/2018 Reason for consult: Initial assessment;Mother's request;Nipple pain/trauma Mom reports infant is not breastfeeding well.  Mom reports she comes off and on the breast/holds nipple in mouth, or pushes it out of her mouth.  Mom reports sore nipples have always been a challenge for her.Reports with all of her other children nipples were sore about the first 4 months. Infant can stick tongue over gumline, but at this time elevating tongue . Assist with feeding in laid back breastfeeding.  At first same thing.  Then infant got into more of a rythmic sucking pattern.  Showed mom how to do massage and compression to help her get more milk while feeding.  Infant had been very fussy on arrival. Gave coconut oil.  Urged mom to hand express rub expressed mother milk on nipples/air dry and then use the coconut oil.Infant breastfed and let go and was content.  Urged mom to feed on cue and hand express and spoon feed back all expressed mothers milk via spoon until she is breastfeeding well.  Urged mom to follow up with lactation as need.  Urged her to follow up with outpatient lactation if nipples  Stay sore that long. Used interpreter Azhir [phone] For Arabic.  Maternal Data Has patient been taught Hand Expression?: Yes Does the patient have breastfeeding experience prior to this delivery?: Yes  Feeding Feeding Type: Breast Fed  LATCH Score Latch: Repeated attempts needed to sustain latch, nipple held in mouth throughout feeding, stimulation needed to elicit sucking reflex.  Audible Swallowing: Spontaneous and intermittent(mom doing massage and compression)  Type of Nipple: Everted at rest and after stimulation  Comfort (Breast/Nipple): Filling, red/small blisters or bruises, mild/mod discomfort  Hold (Positioning): Assistance needed to correctly position infant at breast and  maintain latch.  LATCH Score: 7  Interventions Interventions: Assisted with latch;Hand express;Breast compression;Adjust position;Support pillows;Position options;Coconut oil  Lactation Tools Discussed/Used     Consult Status Consult Status: Follow-up Date: 08/13/18 Follow-up type: In-patient    [facility] Michaelle Copas 08/12/2018, 11:41 PM

## 2018-08-12 NOTE — Plan of Care (Signed)
Mother and Baby bonding well

## 2018-08-13 ENCOUNTER — Encounter: Payer: Medicaid Other | Admitting: Obstetrics & Gynecology

## 2018-08-13 NOTE — Progress Notes (Signed)
Parent request formula to supplement breast feeding due to infant does not seem satisfied after feeding, will not sleep.Parents have been informed of small tummy size of newborn, taught hand expression and understands the possible consequences of formula to the health of the infant. The possible consequences shared with patent include 1) Loss of confidence in breastfeeding 2) Engorgement 3) Allergic sensitization of baby(asthema/allergies) and 4) decreased milk supply for mother.After discussion of the above the mother decided to give infant formula supplimentation.The  tool used to give formula supplement will be bottle and nipple. Discussed using interpreter #[id number] Maryln Gottron).

## 2018-08-17 ENCOUNTER — Telehealth: Payer: Self-pay

## 2018-08-17 NOTE — Telephone Encounter (Signed)
New message    Just an FYI. We have made several attempts to contact this patient including sending a letter to schedule or reschedule their echocardiogram. We will be removing the patient from the echo WQ.   Thank you 

## 2018-09-02 ENCOUNTER — Ambulatory Visit: Payer: Self-pay

## 2018-09-02 ENCOUNTER — Other Ambulatory Visit: Payer: Self-pay | Admitting: General Practice

## 2018-09-02 MED ORDER — FLUCONAZOLE 150 MG PO TABS: 150.0000 mg | ORAL_TABLET | Freq: Two times a day (BID) | ORAL | 0 refills | Status: AC

## 2018-09-02 NOTE — Lactation Note (Signed)
This note was copied from a baby's chart. Lactation Consultation Note  Patient Name: Jane Walton [id number] Date: 09/02/2018   09/02/2018  Name: Jane Walton MRN: [id number] Date of Birth: 08/11/2017 Gestational Age: Gestational Age: [redacted]w[redacted]d Birth Weight: 119.2 oz Weight today:    8 pounds 15.6 ounces (4070 grams) with clean size 3 diaper  24 week old infant presents today for feeding assessment. Mom reports infant is not latching well to the breast. Mom with nipple and breast pain and infant with oral/perineal thrush that is being treated. Mother speaks Arabic, Spoke with mother with assistance of in person Arabic Interpreter [employer].   Infant has gained 885 grams in the last 20 days with an average daily weight gain of 44 grams a day.   Infant is feeding with an unnamed brand bottle from [employer]. Mom reports she feeds very quickly. Infant choking on the bottle nipple in the office. Showed mom paced bottle feeding and recommended getting a Dr. Theora Gianotti Level 1 nipple to try. Infant spitting several times in the office, mom reports infant spits after formula feeding. Mom asked about changing formula from [employer], reviewed she would have to go through her Ped and WIC.   Mom reports she has been trying to latch infant to the breast she reports infant will not latch. She reports she waits until infant is hungry and tries to get her to latch, enc mom to try when infant less hungry and to also feed infant a little in a bottle and then latch to the breast.   Mom is pumping during the day every 3-4 hours. She is not pumping for 8-9 hours at night due to nipple pain. Reviewed supply and demand and importance of emptying breasts every time infant feeds, mom reports she is aware but cannot due to pain. Mom reports nipple pain and burning, she reports shooting pains to her breasts and reports breasts hurt all the time. Nipples are pink and reddened at the base and have been peeling, mom  using Lanolin to nipple. Advised to stop Lanolin and apply Lotrimin AF or Monistat. Requested from Marylynn Pearson, RN at [facility] for Diflucan be called into Pharmacy, mom aware she needs to pick up. Gave mom # 27 flanges to try and advised to use Coconut oil with pumping. Mom using manual pump, enc her to ask WIC if she qualifies for Electric pump from [facility]. Mom reports infant needs to be held a lot and it is difficult to pump when she is alone with the infant.   Mom is not wanting to put infant to breast due to infant oral thrush and nipple pain. Discussed with mom that if nipples begin to heal, she can put infant back to breast since infant and mom and being treated. Mom reports infant is being treated for oral and perineal yeast with Nystatin Ointment and cream. Infant with white patches in cheeks and yeast type rash to diaper area.  Mom reports infant gassy, she is giving gas gtts. She reports she feels infant is more gassy on the formula. Enc mom to burp infant frequently and to keep her upright for 20 minutes after feeding. Enc mom to try paced bottle feeding and a slower flow nipple.   Mom would like to follow up with Lactation , she would like to combine with her appt at [facility] on 3/12. Mom to call with questions/concerns as needed.       General Information: Mother's reason for visit:  Feeding assessment, difficult latch, painful nipples and breasts Consult: Initial Lactation consultant: Noralee Stain RN,IBCLC Breastfeeding experience: not latching due to infant oral thrush and nipple pain Maternal medical conditions: Thyroid(Hypothyroidism) Maternal medications: Other  Breastfeeding History: Frequency of breast feeding: not latching    Supplementation: Supplement method: bottle(unnames brand) Brand: Daron Offer Formula volume: 3-4 ounces Formula frequency: every 3 hours during the day   Breast milk volume: 3-4 ounces Breast milk frequency: every 3 hours , during the night    Pump type: Manual Pump frequency: every 3-4 hours during the day Pump volume: 4-5 ounces  Infant Output Assessment: Voids per 24 hours: 8-10 Urine color: Clear yellow Stools per 24 hours: 5 Stool color: Yellow  Breast Assessment: Breast: Soft, Compressible Nipple: Erect, Reddened, Other(peeling) Pain level: 8 Pain interventions: Bra, Coconut oil, Lanolin, Breast pump  Feeding Assessment: Infant oral assessment: Variance Infant oral assessment comment: Infant with thick labial frenulum that inserts at the bottom of the gum ridge, mid center gum ridge notched. infant with good tongue mobility.                                 Additional Feeding Assessment:                                    Totals:   Total supplement given: 1 ounce formula via bottle, infant spit large amount, mom to finish feeding at home. Total amount pumped post feed: did not pump   Plan:   1. Feed infant at the breast as infant and mom would like, it is ok to breast feed if mom and baby are both being treated for yeast 2. Offer infant the breast when drowsy  3. Feed infant 1/2-1 ounce in the bottle first and then offer her the breast 4. Empty the first breast before offering the second breast 5. Infant needs about 75-100 ml (2.5-3.5 ounces) for 8 feedings a day or 600-800 ml (20-26 ounces) in 24 hours. Infant may take more or less depending on how often infant feeds. Feed her until she is satisfied 6. Feed infant using the paced bottle feeding method (video on [email or web address])  7. May be helpful to infant to use a Dr. Theora Gianotti Level 1 nipple 8. Continue to pump about 7-8 x a day for 15 minutes on each breast to protect milk supply. Would recommend you pump each time infant getting a bottle. Ask WIC about getting an electric breast pump if needed.  9. Try the #27 flange with pumping, lubricate with Coconut oil before pumping 10. Treat mom and baby using the Patient instructions for  care of Ginette Pitman of Mother and Baby Handout 102. LC will request Diflucan to be called in for mom, pick up at your pharmacy 12. Keep up the good work 13. Call with any questions/concerns as needed [phone] 14. Thank you for allowing me to assist you today 15. Follow up with Lactation on March 12   [facility] RN, IBCLC                                                     Ed Blalock 09/02/2018, 10:34 AM

## 2018-09-09 ENCOUNTER — Other Ambulatory Visit: Payer: Self-pay

## 2018-09-09 ENCOUNTER — Ambulatory Visit (INDEPENDENT_AMBULATORY_CARE_PROVIDER_SITE_OTHER): Payer: Medicaid Other | Admitting: Clinical

## 2018-09-09 ENCOUNTER — Ambulatory Visit (INDEPENDENT_AMBULATORY_CARE_PROVIDER_SITE_OTHER): Payer: Medicaid Other | Admitting: Obstetrics and Gynecology

## 2018-09-09 ENCOUNTER — Other Ambulatory Visit (HOSPITAL_COMMUNITY)
Admission: RE | Admit: 2018-09-09 | Discharge: 2018-09-09 | Disposition: A | Discharge status: Home / Self Care | Payer: Medicaid Other | Source: Ambulatory Visit | Attending: Obstetrics and Gynecology | Admitting: Obstetrics and Gynecology

## 2018-09-09 ENCOUNTER — Encounter: Payer: Self-pay | Admitting: Obstetrics and Gynecology

## 2018-09-09 ENCOUNTER — Telehealth: Payer: Self-pay | Admitting: *Deleted

## 2018-09-09 DIAGNOSIS — Z3202 Encounter for pregnancy test, result negative: Secondary | ICD-10-CM

## 2018-09-09 DIAGNOSIS — F4321 Adjustment disorder with depressed mood: Secondary | ICD-10-CM | POA: Diagnosis not present

## 2018-09-09 DIAGNOSIS — Z30430 Encounter for insertion of intrauterine contraceptive device: Secondary | ICD-10-CM | POA: Insufficient documentation

## 2018-09-09 LAB — POCT PREGNANCY, URINE: Preg Test, Ur: NEGATIVE

## 2018-09-09 MED ORDER — PARAGARD INTRAUTERINE COPPER IU IUD
INTRAUTERINE_SYSTEM | Freq: Once | INTRAUTERINE | Status: AC
  Administered 2018-09-09: 1 via INTRAUTERINE

## 2018-09-09 MED ORDER — PARAGARD INTRAUTERINE COPPER IU IUD: INTRAUTERINE_SYSTEM | Freq: Once | INTRAUTERINE | Status: DC

## 2018-09-09 NOTE — BH Specialist Note (Signed)
Integrated Behavioral Health Initial Visit  MRN: [id number] Name: Jane Walton  Number of Integrated Behavioral Health Clinician visits:: 1/6 Session Start time: 10:58  Session End time: 11:25 Total time: 30 minutes  Type of Service: Integrated Behavioral Health- Individual/Family Interpretor:Yes.   Interpretor Name and Language: Suzi Roots, Arabic   Warm Hand Off Completed.       SUBJECTIVE: Jane Walton is a 38 y.o. female accompanied by newborn daughter Patient was referred by Venia Carbon, NP for positive depression screen. Patient reports the following symptoms/concerns: Pt states her primary concern is lack of quality sleep, lack of appetite, and feeling overwhelmed at times.Pt says she sleeps best when baby sleeps;   Duration of problem: Postpartum; Severity of problem: moderate  OBJECTIVE: Mood: Depressed and Affect: Appropriate Risk of harm to self or others: No plan to harm self or others  LIFE CONTEXT: Family and Social: Pt lives with her husband and children (13yo,11yo,9yo;newborn) School/Work: - Self-Care: - Life Changes: Recent childbirth  GOALS ADDRESSED: Patient will: 1. Reduce symptoms of: depression and stress 2. Increase knowledge and/or ability of: healthy habits and stress reduction  3. Demonstrate ability to: Increase healthy adjustment to current life circumstances  INTERVENTIONS: Interventions utilized: Solution-Focused Strategies and Psychoeducation and/or Health Education  Standardized Assessments completed: Edinburgh Postnatal Depression  ASSESSMENT: Patient currently experiencing Adjustment disorder with depressed mood.   Patient may benefit from psychoeducation and brief therapeutic interventions regarding coping with symptoms of depression .  PLAN: 1. Follow up with behavioral health clinician on : As needed 2. Behavioral recommendations:  -Continue taking all medication as prescribed by medical providers -Read educational materials  regarding coping with symptoms of depression (Arabic) -20 minute nap at beginning of baby's morning nap -Consider using sleep sounds at nighttime for improved family sleep, as discussed  3. Referral(s): [facility] (In Clinic)  [address] [place], [place]  Depression screen [facility] 2/9 06/17/2018 05/13/2018 03/31/2018 02/01/2018  Decreased Interest 1 1 1  0  Down, Depressed, Hopeless 1 1 1  0  PHQ - 2 Score 2 2 2  0  Altered sleeping 2 0 1 0  Tired, decreased energy 1 1 2  0  Change in appetite 0 0 0 0  Feeling bad or failure about yourself  0 1 0 0  Trouble concentrating 1 2 2  0  Moving slowly or fidgety/restless 1 0 0 0  Suicidal thoughts 0 0 0 0  PHQ-9 Score 7 6 7  0   GAD 7 : Generalized Anxiety Score 06/17/2018 05/13/2018 03/31/2018 02/01/2018  Nervous, Anxious, on Edge 0 1 3 0  Control/stop worrying 0 0 1 0  Worry too much - different things 0 1 1 0  Trouble relaxing 0 0 2 0  Restless 0 0 1 0  Easily annoyed or irritable 2 1 2  0  Afraid - awful might happen 0 1 0 0  Total GAD 7 Score 2 4 10  0

## 2018-09-09 NOTE — Patient Instructions (Signed)

## 2018-09-09 NOTE — Telephone Encounter (Signed)
Received a voicemail from [name], Case manager with [facility].States she wants to clarify information she received from [place]- states hard to understand even with Interpreter. Wants to clarify her meds, appointments, etc.  I called [name] back and she is with [facility] which is thru [place] . I clarafied she had seen a provider ,and LC and her meds as prescribed for yeast on her breast and her follow up appt and that it was recommended she make another followup with LC.  She voices understanding and she will follow up with the patient.

## 2018-09-09 NOTE — Progress Notes (Addendum)
Subjective:     Jane Walton is a 38 y.o. female who presents for a postpartum visit. She is 4 weeks postpartum following a spontaneous vaginal delivery. I have fully reviewed the prenatal and intrapartum course. The delivery was at 40.3 gestational weeks. Outcome: spontaneous vaginal delivery. Anesthesia: epidural. Postpartum course has been unremarkable. Baby's course has been unremarkable. Baby is feeding by both breast and bottle - Similac with Iron. Bleeding staining only. Bowel function is abnormal: constipation. Bladder function is normal. Patient is not sexually active. Contraception method is IUD. Postpartum depression screening: positive.  No intercourse since delivery.   The following portions of the patient's history were reviewed and updated as appropriate: allergies, current medications, past family history, past medical history, past social history, past surgical history and problem list.  Review of Systems Pertinent items are noted in HPI.   Objective:    BP 115/76   Pulse 73   Wt 176 lb 11.2 oz (80.2 kg)   LMP 10/25/2017   BMI 30.33 kg/m   General:  alert and cooperative  Lungs: clear to auscultation bilaterally  Heart:  regular rate and rhythm, S1, S2 normal, no murmur, click, rub or gallop  Abdomen: soft, non-tender; bowel sounds normal; no masses,  no organomegaly   Vulva:  normal  Vagina: normal vagina  Cervix:  no lesions  Corpus: normal  Adnexa:  not evaluated  Rectal Exam: Not performed.        Assessment:     Normal postpartum exam. Pap smear done at today's visit.   Plan:   1. Contraception: IUD 2. Will f/u pap results  3. Follow up in: 4 week for IUD string check        GYNECOLOGY CLINIC PROCEDURE NOTE  Jane Walton is a 38 y.o. [id number] here for ParaGard  IUD insertion. No GYN concerns.   IUD Insertion Procedure Note Patient identified, informed consent performed, consent signed.   Discussed risks of irregular bleeding, cramping,  infection, malpositioning or misplacement of the IUD outside the uterus which may require further procedure such as laparoscopy. Time out was performed.  Urine pregnancy test negative.  Speculum placed in the vagina.  Cervix visualized.  Cleaned with Betadine x 2.  Grasped anteriorly with a single tooth tenaculum.  Uterus sounded to 6 cm.  ParaGard  IUD placed per manufacturer's recommendations.  Strings trimmed to 3 cm. Tenaculum was removed, good hemostasis noted.  Patient tolerated procedure well.   Patient was given post-procedure instructions.  She was advised to have backup contraception for one week.  Patient was also asked to check IUD strings periodically and follow up in 4 weeks for IUD check.   Duane Lope, NP 09/09/2018 1:52 PM

## 2018-09-10 NOTE — Progress Notes (Signed)
  Jane Walton is a 38 y.o. female who presents for a postpartum visit. She is 4 weeks postpartum following a spontaneous vaginal delivery. I have fully reviewed the prenatal and intrapartum course. The delivery was at 40.3 gestational weeks. Outcome: spontaneous vaginal delivery. Anesthesia: epidural. Postpartum course has been unremarkable. Baby's course has been unremarkable. Baby is feeding by both breast and bottle - Similac with Iron. Bleeding staining only. Bowel function is abnormal: constipation. Bladder function is normal. Patient is not sexually active. Contraception method is IUD. Postpartum depression screening: positive.  No intercourse since delivery.   The following portions of the patient's history were reviewed and updated as appropriate: allergies, current medications, past family history, past medical history, past social history, past surgical history and problem list.  Review of Systems Pertinent items are noted in HPI.   Objective:    BP 115/76   Pulse 73   Wt 176 lb 11.2 oz (80.2 kg)   LMP 10/25/2017   BMI 30.33 kg/m   General:  alert and cooperative  Lungs: clear to auscultation bilaterally  Heart:  regular rate and rhythm, S1, S2 normal, no murmur, click, rub or gallop  Abdomen: soft, non-tender; bowel sounds normal; no masses,  no organomegaly   Vulva:  normal  Vagina: normal vagina  Cervix:  no lesions  Corpus: normal  Adnexa:  not evaluated  Rectal Exam: Not performed.        Assessment:     Normal postpartum exam. Pap smear done at today's visit.   Plan:   1. Contraception: IUD 2. Will f/u pap results  3. Follow up in: 4 week for IUD string check        GYNECOLOGY CLINIC PROCEDURE NOTE  Jane Walton is a 38 y.o. [id number] here for ParaGard  IUD insertion. No GYN concerns.   IUD Insertion Procedure Note Patient identified, informed consent performed, consent signed.   Discussed risks of irregular bleeding, cramping, infection,  malpositioning or misplacement of the IUD outside the uterus which may require further procedure such as laparoscopy. Time out was performed.  Urine pregnancy test negative.  Speculum placed in the vagina.  Cervix visualized.  Cleaned with Betadine x 2.  Grasped anteriorly with a single tooth tenaculum.  Uterus sounded to 6 cm.  ParaGard  IUD placed per manufacturer's recommendations.  Strings trimmed to 3 cm. Tenaculum was removed, good hemostasis noted.  Patient tolerated procedure well.   Patient was given post-procedure instructions.  She was advised to have backup contraception for one week.  Patient was also asked to check IUD strings periodically and follow up in 4 weeks for IUD check.   Duane Lope, NP 09/09/2018 1:52 PM

## 2018-09-14 LAB — CYTOLOGY - PAP
Diagnosis: UNDETERMINED — AB
HPV: NOT DETECTED

## 2018-09-16 ENCOUNTER — Ambulatory Visit: Payer: Medicaid Other | Admitting: Cardiovascular Disease

## 2018-09-24 ENCOUNTER — Encounter: Payer: Self-pay | Admitting: *Deleted

## 2018-10-07 ENCOUNTER — Ambulatory Visit: Payer: Medicaid Other | Admitting: Obstetrics and Gynecology

## 2018-10-19 ENCOUNTER — Ambulatory Visit: Payer: Medicaid Other | Admitting: Advanced Practice Midwife

## 2018-12-11 ENCOUNTER — Other Ambulatory Visit: Payer: Self-pay | Admitting: Family Medicine

## 2018-12-11 DIAGNOSIS — E559 Vitamin D deficiency, unspecified: Secondary | ICD-10-CM

## 2019-07-20 IMAGING — US US MFM OB DETAIL+14 WK
1 series · 14 of 28 positions shown · non-contrast
Comparison: none

[Series 1: us mfm ob detail+14 wk · 14 of 103 slices shown]
[im 4/103]
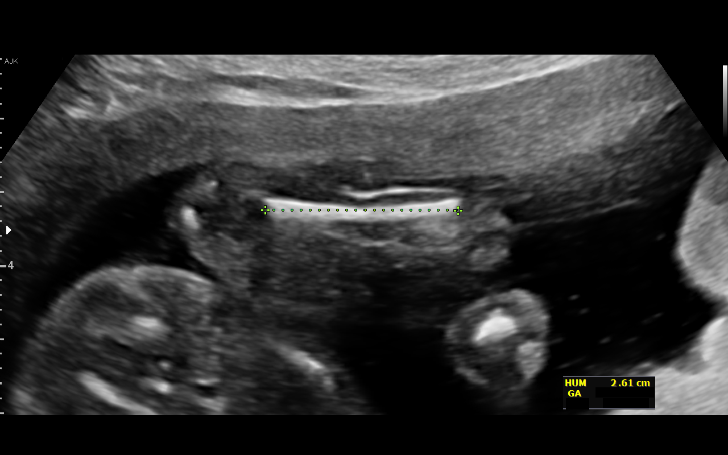
[im 12/103]
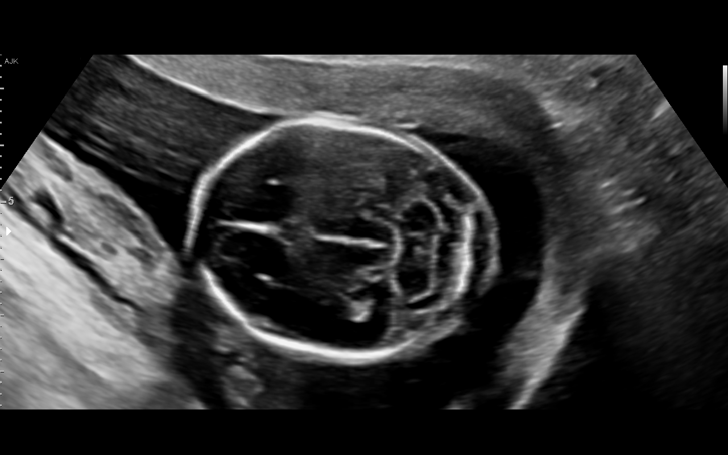
[im 19/103]
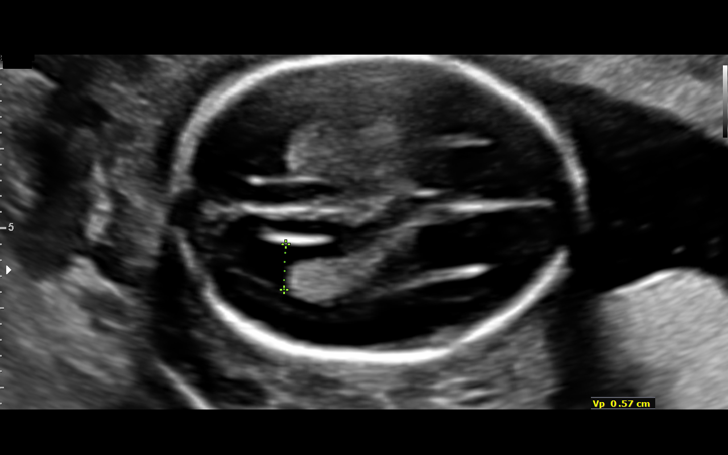
[im 27/103]
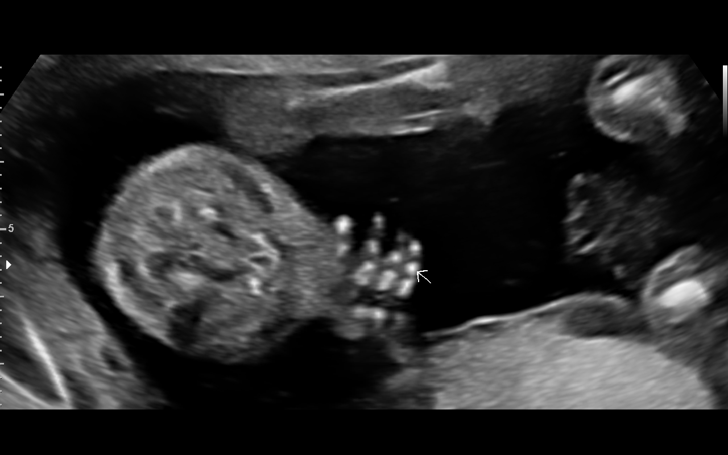
[im 35/103]
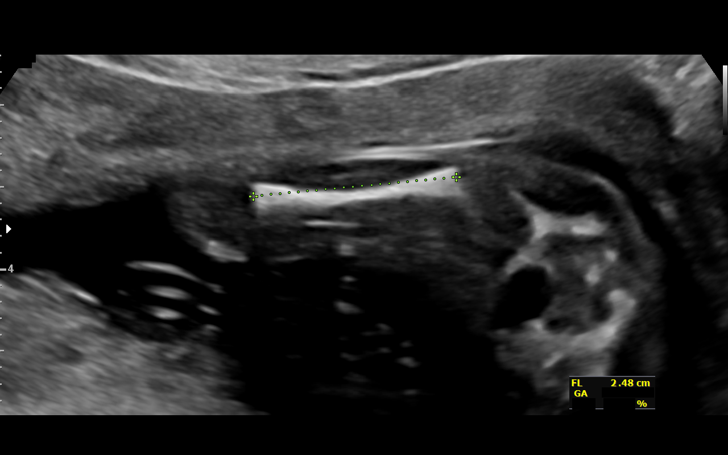
[im 42/103]
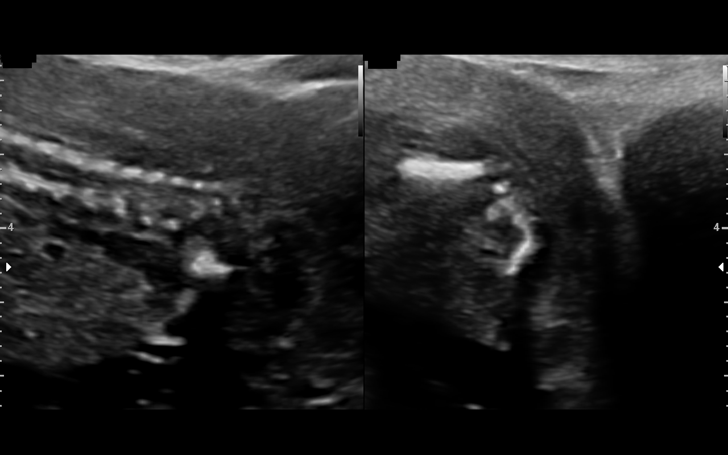
[im 50/103]
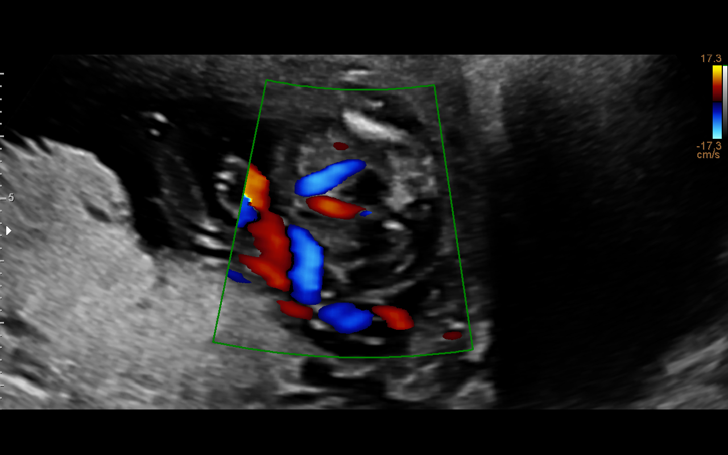
[im 57/103]
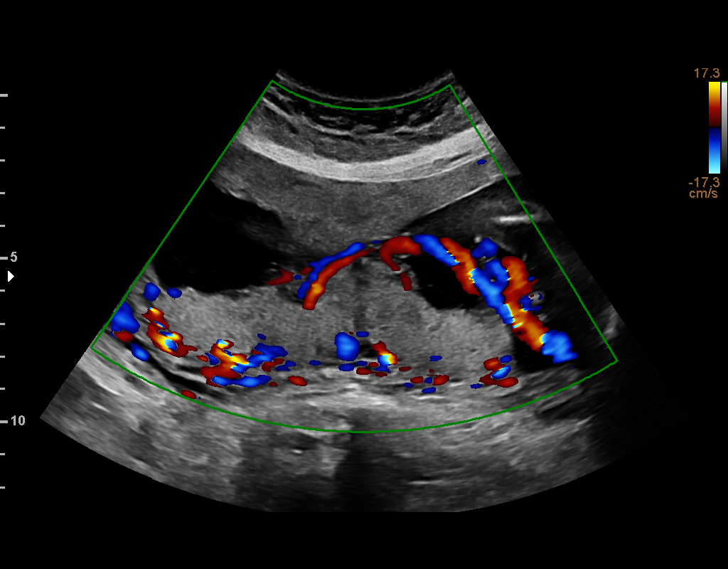
[im 65/103]
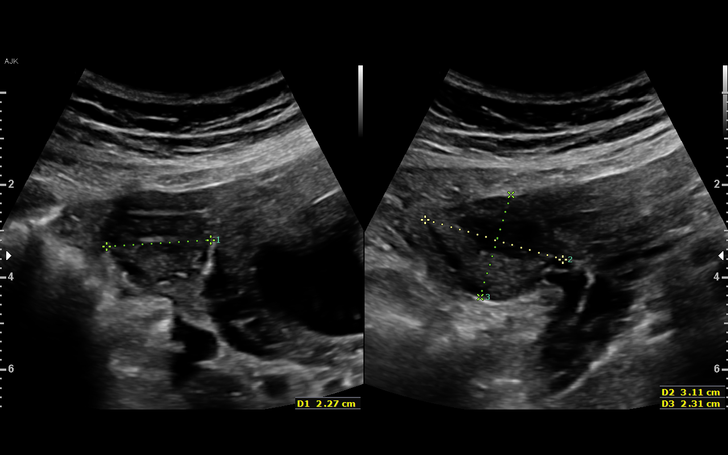
[im 72/103]
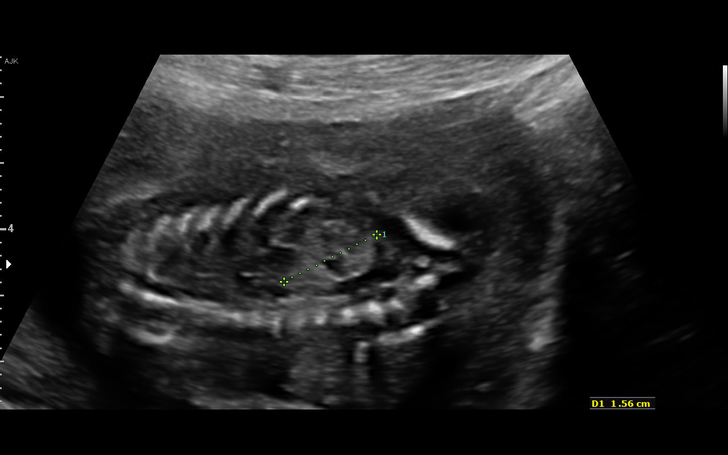
[im 80/103]
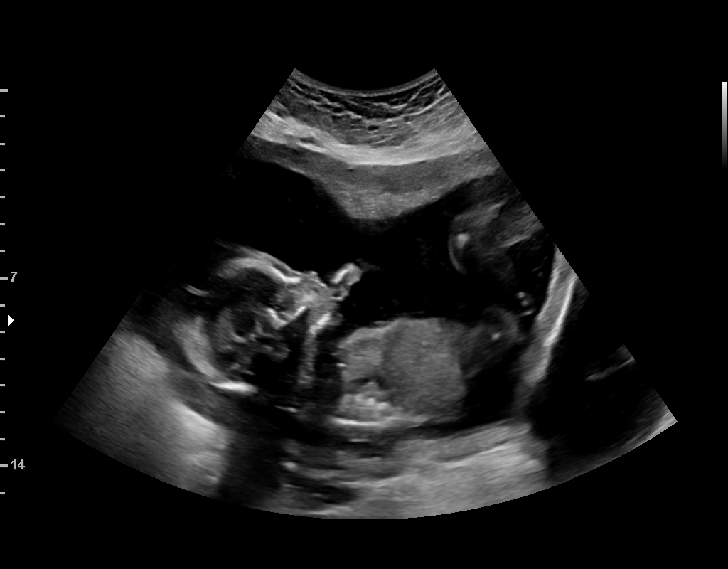
[im 87/103]
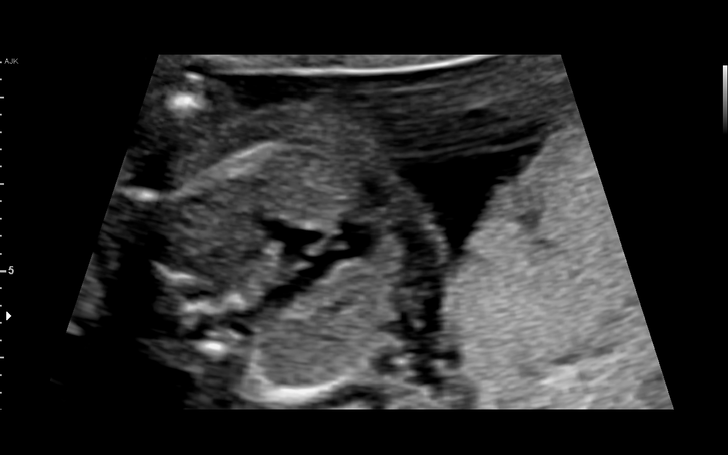
[im 95/103]
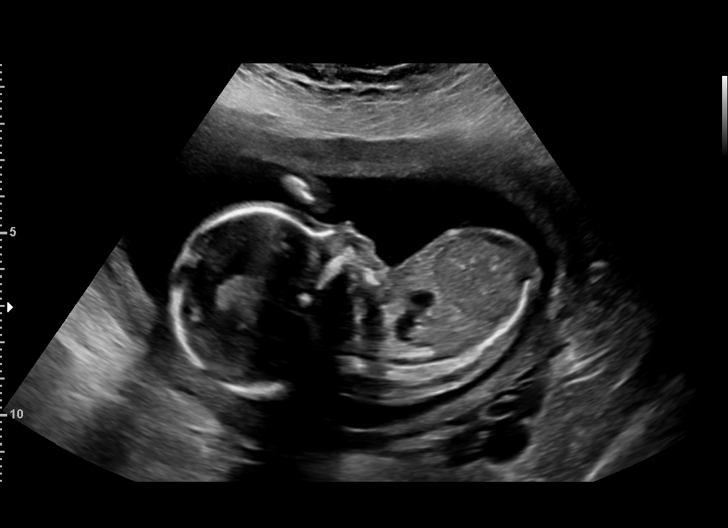
[im 103/103]
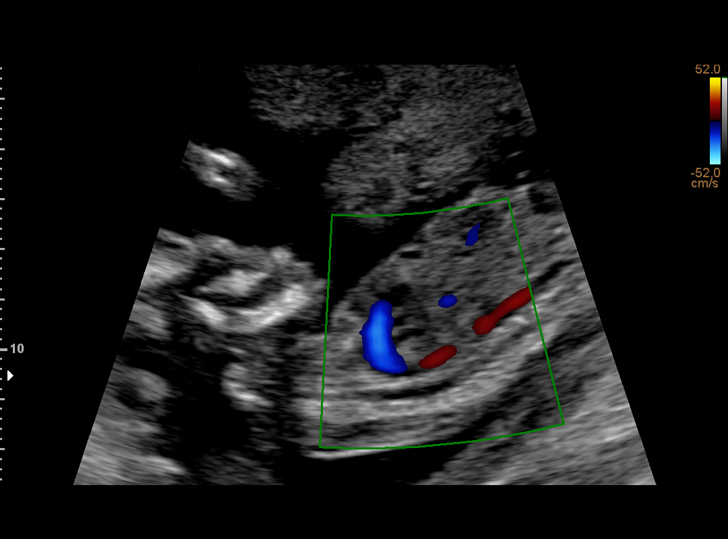

[14 of 28 positions shown; findings below may reference images not displayed]

Indications

Encounter for antenatal screening for
malformations
Advanced maternal age multigravida (37),
second trimester (low risk panorama)
Hypothyroid
18 weeks gestation of pregnancy
Vital Signs

BMI:
Fetal Evaluation

Num Of Fetuses:         1
Fetal Heart Rate(bpm):  143
Cardiac Activity:       Observed
Presentation:           Variable
Placenta:               Posterior
P. Cord Insertion:      Visualized, central

Amniotic Fluid
AFI FV:      Within normal limits

Largest Pocket(cm)
3.31
Biometry

BPD:      39.6  mm     G. Age:  18w 0d         39  %    CI:        71.36   %    70 - 86
FL/HC:      17.2   %    15.8 - 18
HC:      149.3  mm     G. Age:  18w 0d         29  %    HC/AC:      1.08        1.07 -
AC:      138.3  mm     G. Age:  19w 2d         77  %    FL/BPD:     64.9   %
FL:       25.7  mm     G. Age:  17w 6d         27  %    FL/AC:      18.6   %    20 - 24
HUM:        26  mm     G. Age:  18w 1d         52  %
CER:      18.8  mm     G. Age:  18w 3d         54  %
NFT:       4.8  mm

LV:        5.7  mm
CM:        4.1  mm

Est. FW:     243  gm      0 lb 9 oz     51  %
OB History

Gravidity:    6         Term:   3        Prem:   0        SAB:   2
TOP:          0       Ectopic:  0        Living: 3
Gestational Age

LMP:           19w 1d        Date:  10/25/17                 EDD:   08/01/18
U/S Today:     18w 2d                                        EDD:   08/07/18
Best:          18w 2d     Det. By:  Previous Ultrasound      EDD:   08/07/18
(12/28/17)
Anatomy

Cranium:               Appears normal         LVOT:                   Appears normal
Cavum:                 Appears normal         Aortic Arch:            Appears normal
Ventricles:            Appears normal         Ductal Arch:            limited views
appear nml
Choroid Plexus:        Appears normal         Diaphragm:              Appears normal
Cerebellum:            Appears normal         Stomach:                Appears normal, left
sided
Posterior Fossa:       Appears normal         Abdomen:                Appears normal
Nuchal Fold:           Appears normal         Abdominal Wall:         Appears nml (cord
insert, abd wall)
Face:                  Appears normal         Cord Vessels:           Appears normal (3
(orbits and profile)                           vessel cord)
Lips:                  Appears normal         Kidneys:                Appear normal
Palate:                Not well visualized    Bladder:                Appears normal
Thoracic:              Appears normal         Spine:                  Limited views
Heart:                 Appears normal         Upper Extremities:      Appears normal
(4CH, axis, and
situs)
RVOT:                  Appears normal         Lower Extremities:      Appears normal

Other:  Fetus appears to be female. Technically difficult due to fetal position.
Cervix Uterus Adnexa

Cervix
Length:           4.69  cm.
Persistent LUS CTX

Uterus
No abnormality visualized.

Left Ovary
Within normal limits.

Right Ovary
Within normal limits.
Adnexa
No abnormality visualized.
Impression

We performed fetal anatomy scan. No makers of
aneuploidies or fetal structural defects are seen. Fetal
biometry is consistent with her previously-established dates.
Amniotic fluid is normal and good fetal activity is seen.

On cell-free fetal DNA screening, the risks of fetal
aneuploidies are not increased. MSAFP screening showed
low risk for open-neural tube defects.
Recommendations

An appointment was made for her to return in 4 weeks for
completion of fetal anatomy (spine).

## 2019-09-09 IMAGING — US US MFM OB FOLLOW-UP
1 series · 14 of 28 positions shown · non-contrast
Comparison: none

[Series 1: us mfm ob follow-up · 14 of 58 slices shown]
[im 3/58]
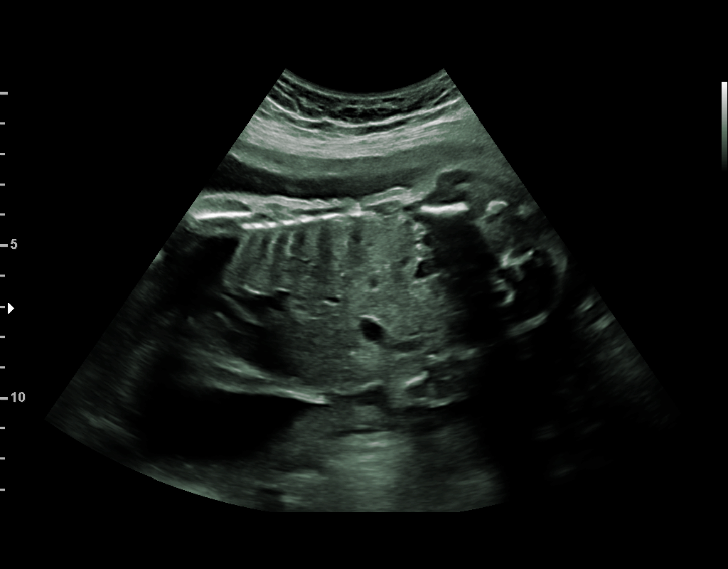
[im 7/58]
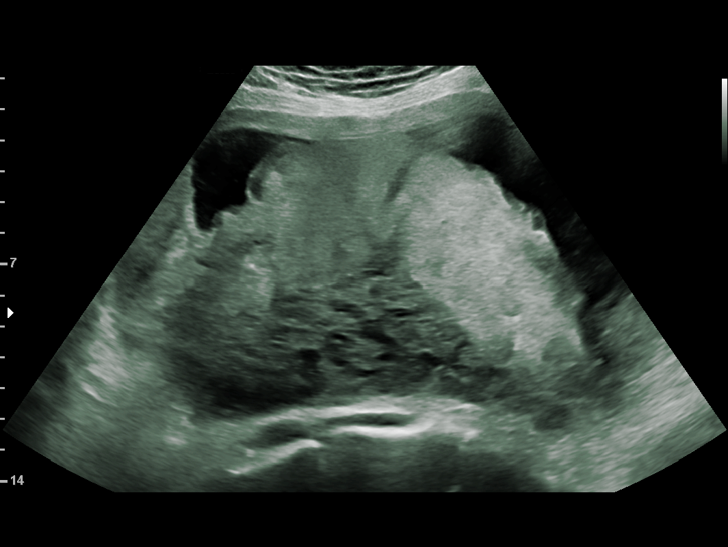
[im 11/58]
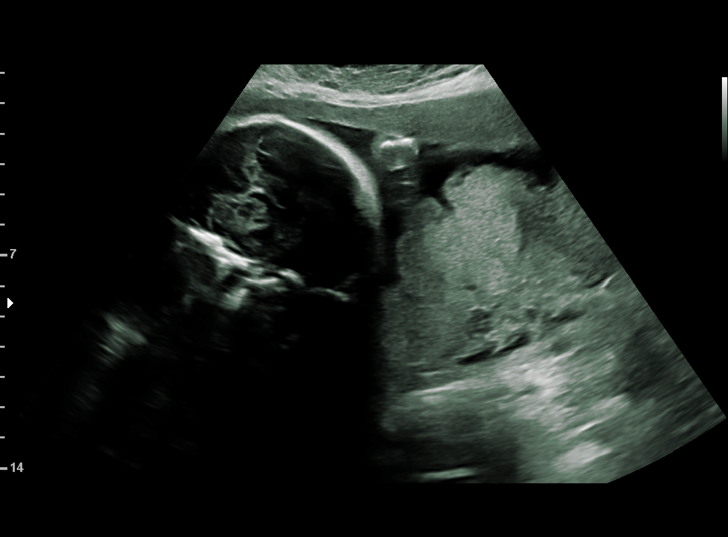
[im 15/58]
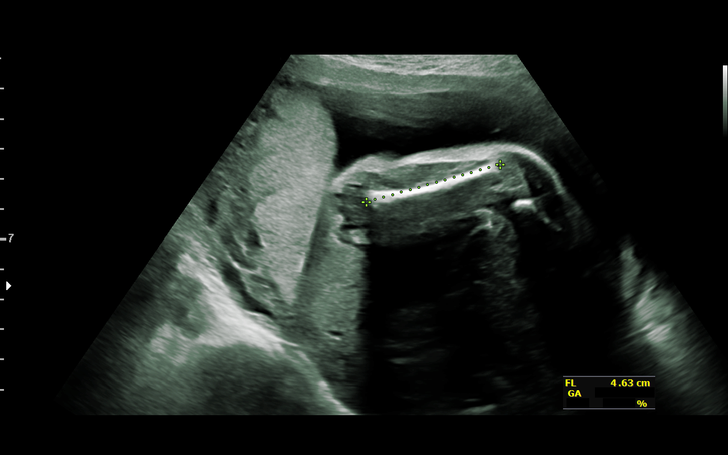
[im 20/58]
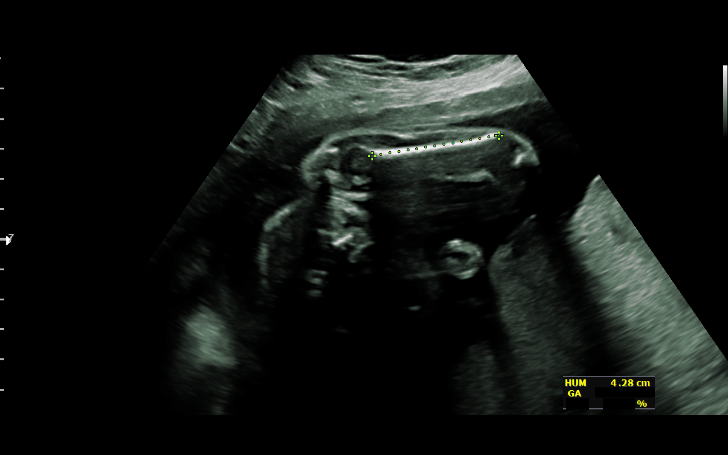
[im 24/58]
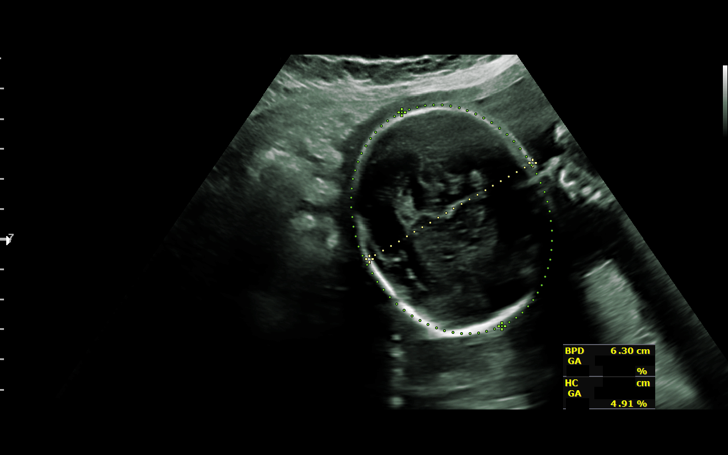
[im 28/58]
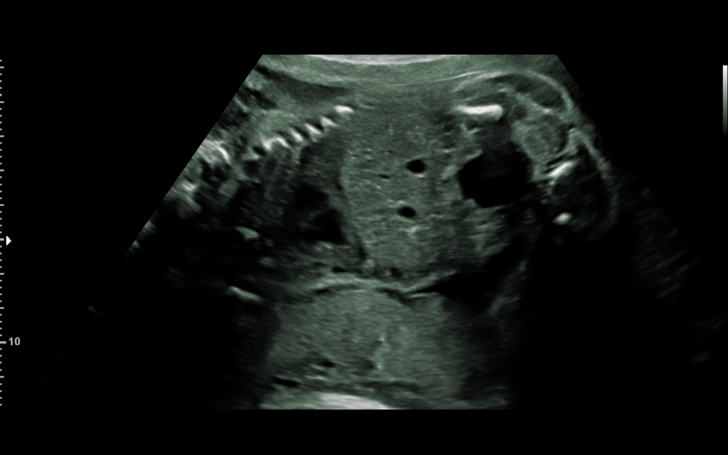
[im 32/58]
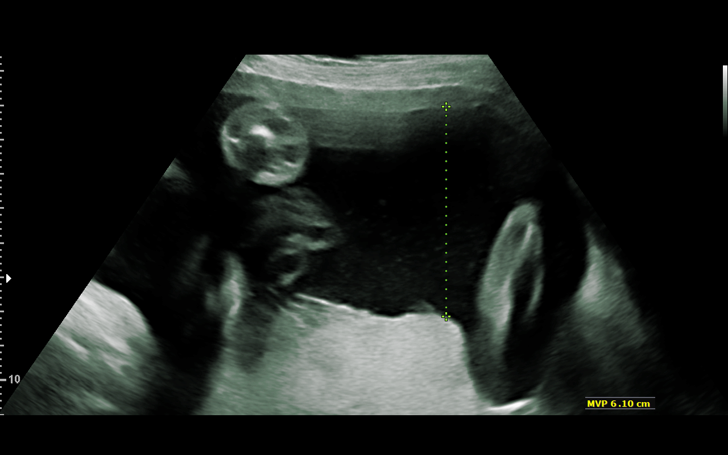
[im 36/58]
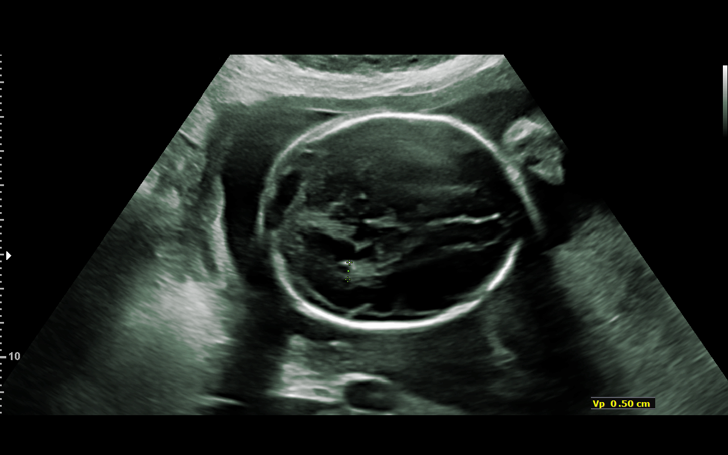
[im 41/58]
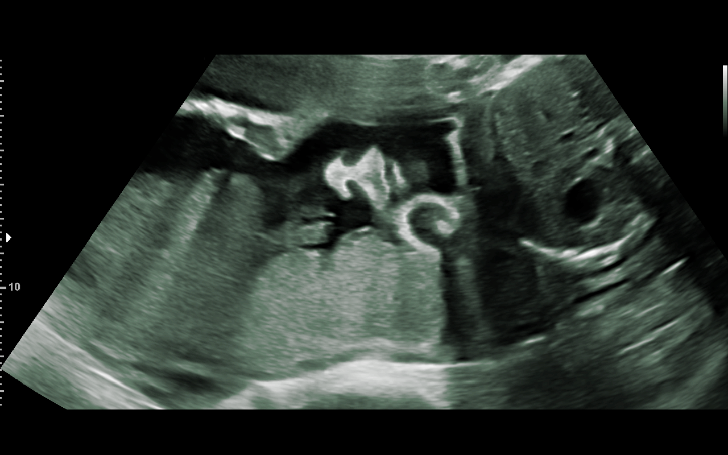
[im 45/58]
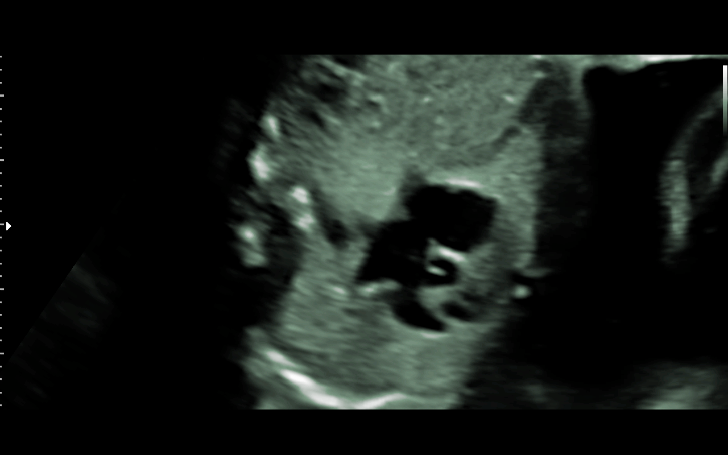
[im 49/58]
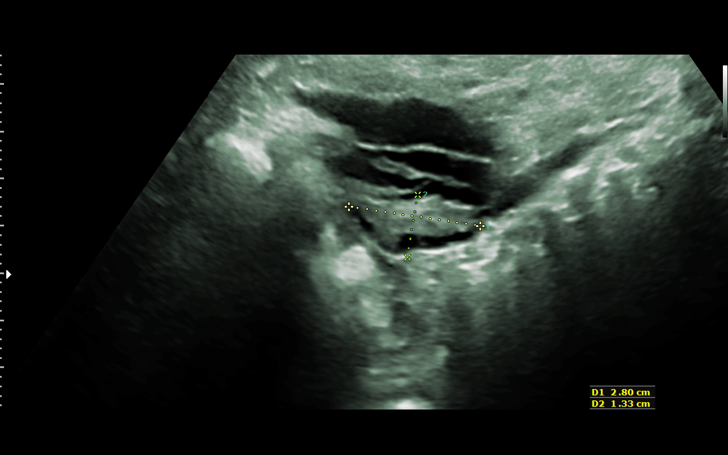
[im 53/58]
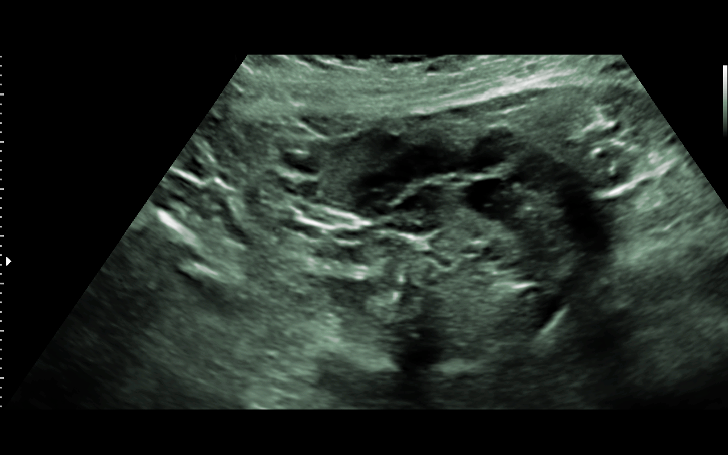
[im 58/58]
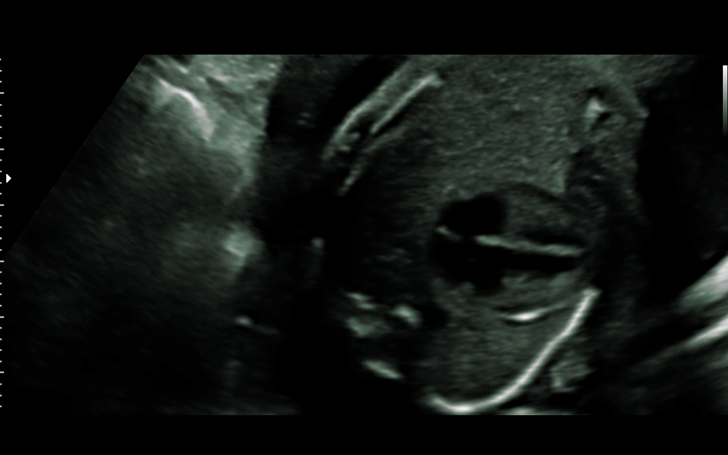

[14 of 28 positions shown; findings below may reference images not displayed]

Indications

Encounter for other antenatal screening
follow-up
25 weeks gestation of pregnancy
Advanced maternal age multigravida (37),
second trimester (low risk panorama)
Hypothyroid
Vital Signs

Height:        5'6"
Fetal Evaluation

Num Of Fetuses:         1
Fetal Heart Rate(bpm):  151
Cardiac Activity:       Observed
Presentation:           Breech
Placenta:               Posterior

Amniotic Fluid
AFI FV:      Within normal limits

Largest Pocket(cm)
6.1
Biometry

BPD:      62.8  mm     G. Age:  25w 3d         36  %    CI:        75.78   %    70 - 86
FL/HC:      20.2   %    18.6 -
HC:      228.7  mm     G. Age:  24w 6d         10  %    HC/AC:      1.12        1.04 -
AC:      203.4  mm     G. Age:  25w 0d         23  %    FL/BPD:     73.7   %    71 - 87
FL:       46.3  mm     G. Age:  25w 3d         31  %    FL/AC:      22.8   %    20 - 24
HUM:      43.6  mm     G. Age:  26w 0d         55  %
CER:      28.8  mm     G. Age:  25w 6d         53  %
LV:          5  mm

Est. FW:     773  gm    1 lb 11 oz      44  %
OB History

Gravidity:    6         Term:   3        Prem:   0        SAB:   2
TOP:          0       Ectopic:  0        Living: 3
Gestational Age

LMP:           26w 3d        Date:  10/25/17                 EDD:   08/01/18
U/S Today:     25w 1d                                        EDD:   08/10/18
Best:          25w 4d     Det. By:  Previous Ultrasound      EDD:   08/07/18
(12/28/17)
Anatomy

Cranium:               Appears normal         LVOT:                   Appears normal
Cavum:                 Appears normal         Aortic Arch:            Appears normal
Ventricles:            Appears normal         Ductal Arch:            Previously seen
Choroid Plexus:        Appears normal         Diaphragm:              Appears normal
Cerebellum:            Appears normal         Stomach:                Appears normal, left
sided
Posterior Fossa:       Appears normal         Abdomen:                Appears normal
Nuchal Fold:           Previously seen        Abdominal Wall:         Appears nml (cord
insert, abd wall)
Face:                  Appears normal         Cord Vessels:           Appears normal (3
(orbits and profile)                           vessel cord)
Lips:                  Appears normal         Kidneys:                Appear normal
Palate:                Appears normal         Bladder:                Appears normal
Thoracic:              Appears normal         Spine:                  Previously seen
Heart:                 Appears normal         Upper Extremities:      Previously seen
(4CH, axis, and situs
RVOT:                  Appears normal         Lower Extremities:      Previously seen

Other:  Heels and left 5th digit visualized.
Cervix Uterus Adnexa

Cervix
Length:            4.6  cm.
Normal appearance by transabdominal scan.

Left Ovary
Within normal limits.

Right Ovary
Within normal limits.
Impression

Fetal growth is appropriate for gestational age. Amniotic fluid
is normal and good fetal activity is seen.

She is euthyroid with treatment.
Recommendations

Follow-up as clinically indicated.
